# Patient Record
Sex: Female | Born: 1985 | Race: White | Hispanic: No | Marital: Married | Smoking: Never smoker
Health system: Southern US, Community
[De-identification: ages and names within clinical notes are randomized; demographics above are authoritative.]

## PROBLEM LIST (undated history)

## (undated) DIAGNOSIS — O009 Unspecified ectopic pregnancy without intrauterine pregnancy: Secondary | ICD-10-CM

## (undated) HISTORY — DX: Unspecified ectopic pregnancy without intrauterine pregnancy: O00.90

## (undated) HISTORY — PX: OTHER SURGICAL HISTORY: SHX169

---

## 2014-02-07 ENCOUNTER — Emergency Department: Payer: Self-pay | Admitting: Emergency Medicine

## 2014-03-12 ENCOUNTER — Ambulatory Visit: Payer: Self-pay | Admitting: Obstetrics & Gynecology

## 2014-09-15 ENCOUNTER — Ambulatory Visit: Payer: Self-pay | Admitting: Obstetrics & Gynecology

## 2015-03-21 IMAGING — US US BREAST*L* LIMITED INC AXILLA
1 series · 8 of 8 positions shown · non-contrast
Comparison: Outside left breast ultrasounds dated 09/02/2013 and
04/03/2013

CLINICAL DATA: 28-year-old female for follow-up of probably benign
left breast findings.

EXAM:
ULTRASOUND OF THE LEFT BREAST

[Series 1: us breast*left* limited inc axilla · 0.08mm/px · 8 of 8 slices shown]
[im 1/8]
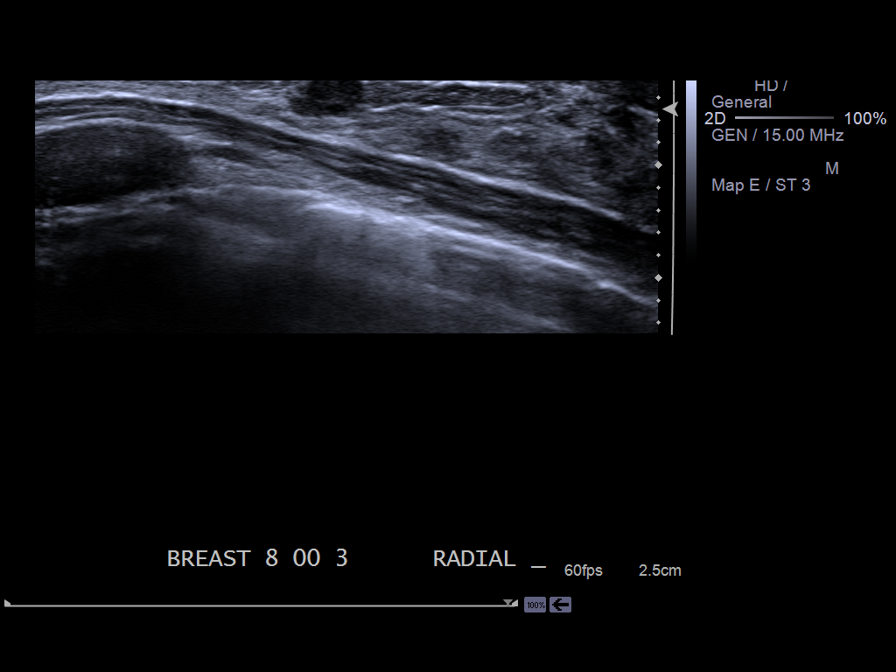
[im 2/8]
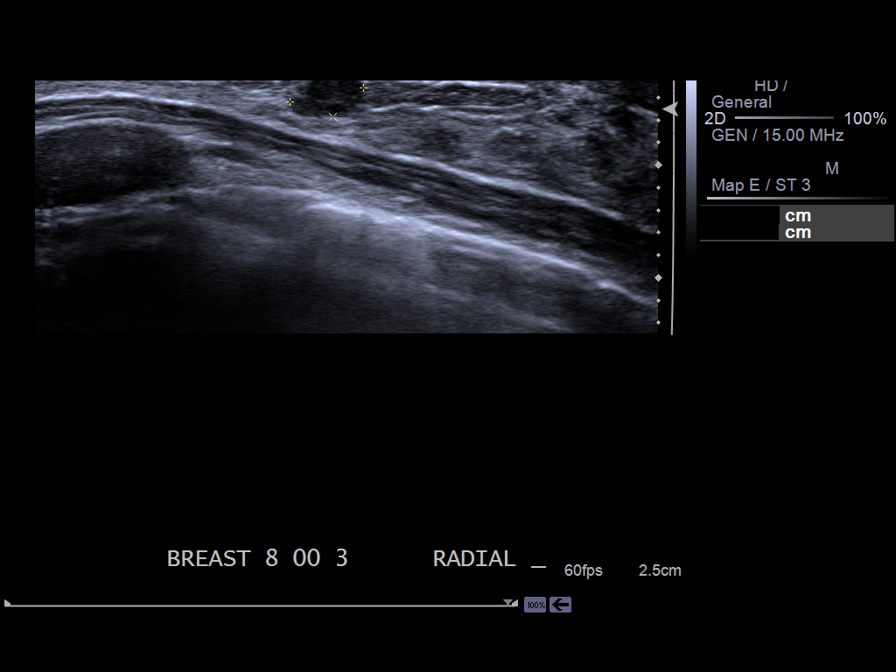
[im 3/8]
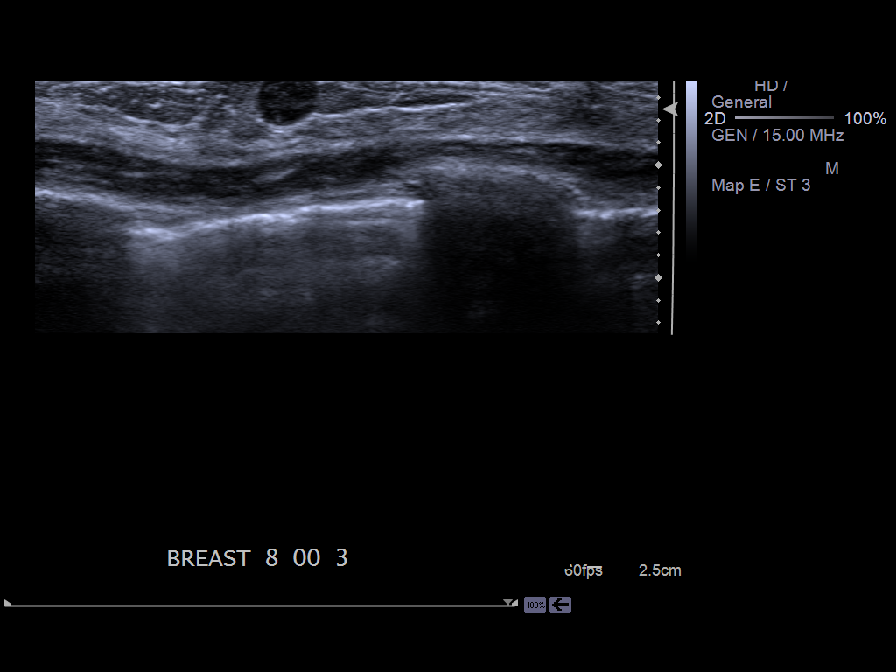
[im 4/8]
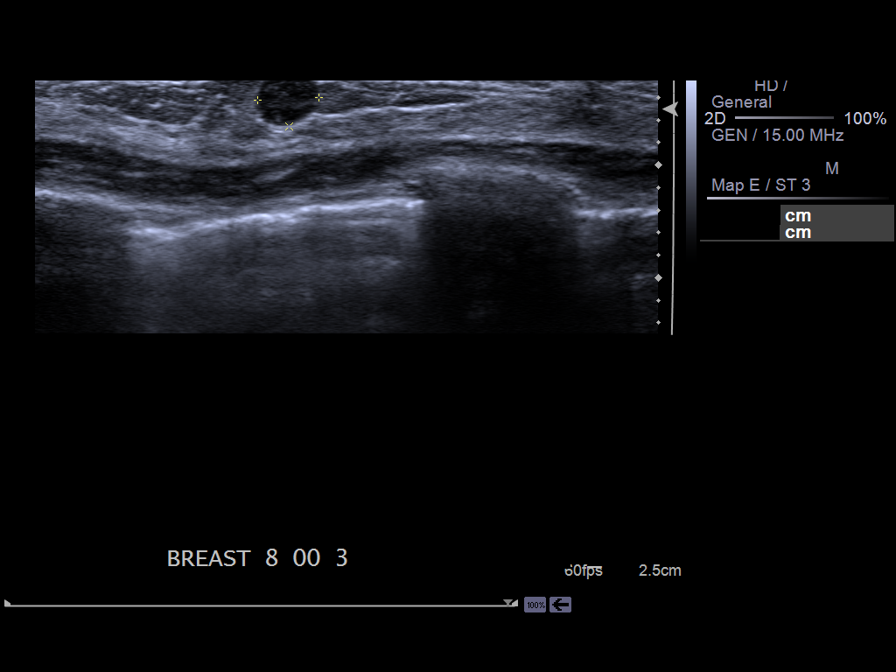
[im 5/8]
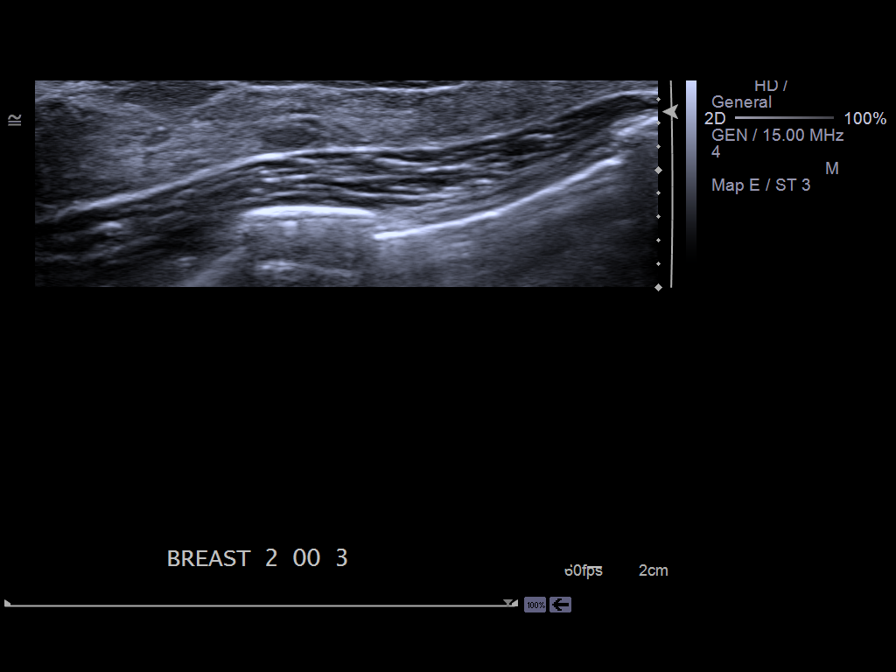
[im 6/8]
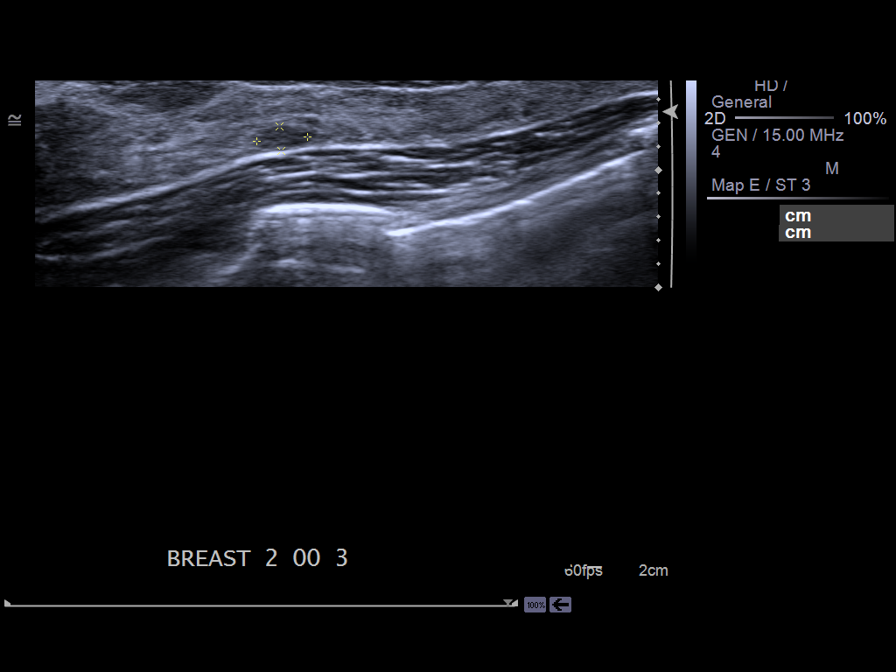
[im 7/8]
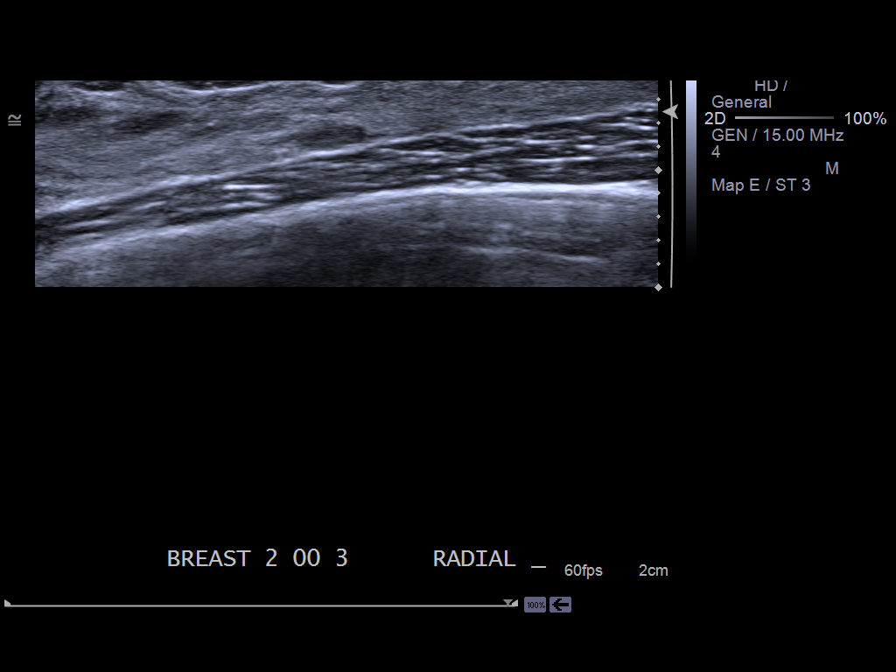
[im 8/8]
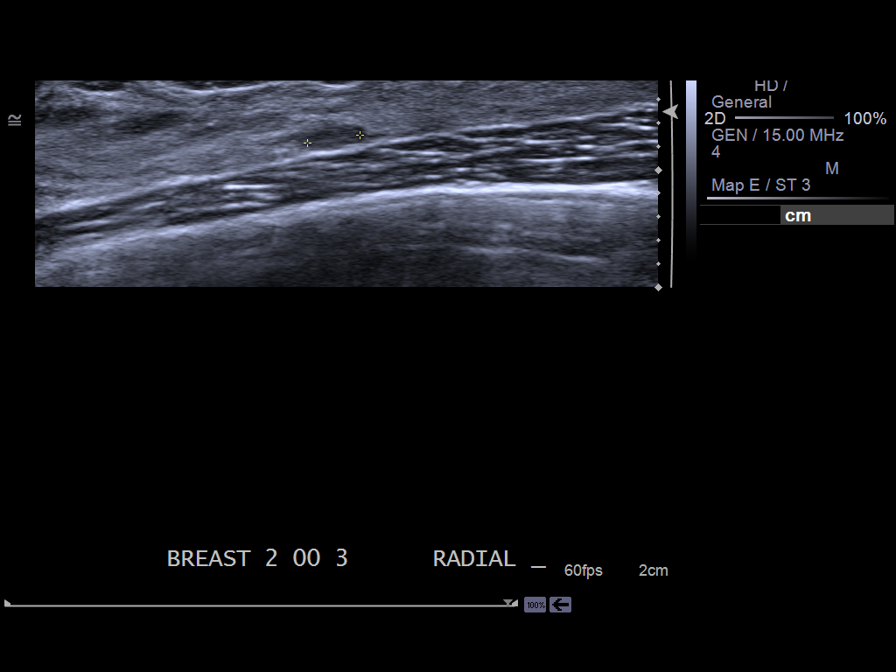

[8 of 8 positions shown; findings below may reference images not displayed]

FINDINGS: On physical exam, a subcentimeter nodule is palpated within the left
breast at 8 o'clock, 3 cm from the nipple. No discrete mass is
palpated within the upper, outer left breast.

Targeted ultrasound of the left breast was performed demonstrating
no significant interval change in the circumscribed, hypoechoic oval
mass at 8 o'clock, 3 cm from the nipple measuring approximately 7 x
4 x 5 mm. In addition, the previously seen oval hypoechoic area
within the upper, outer quadrant is also not significantly changed
measuring 4 x 2 x 5 mm at 2 o'clock, 3 cm from the nipple.
IMPRESSION: Probably benign left breast findings.

RECOMMENDATION:
Left breast ultrasound in 6 months.

I have discussed the findings and recommendations with the patient.
Results were also provided in writing at the conclusion of the
visit. If applicable, a reminder letter will be sent to the patient
regarding the next appointment.

BI-RADS CATEGORY  3: Probably benign finding(s) - short interval
follow-up suggested.

## 2015-04-07 ENCOUNTER — Other Ambulatory Visit: Payer: Self-pay | Admitting: Obstetrics & Gynecology

## 2015-04-07 DIAGNOSIS — N6012 Diffuse cystic mastopathy of left breast: Secondary | ICD-10-CM

## 2015-04-16 ENCOUNTER — Ambulatory Visit
Admission: RE | Admit: 2015-04-16 | Discharge: 2015-04-16 | Disposition: A | Discharge status: Home / Self Care | Payer: BC Managed Care – PPO | Source: Ambulatory Visit | Attending: Obstetrics & Gynecology | Admitting: Obstetrics & Gynecology

## 2015-04-16 DIAGNOSIS — N63 Unspecified lump in breast: Secondary | ICD-10-CM | POA: Diagnosis present

## 2015-04-16 DIAGNOSIS — N6012 Diffuse cystic mastopathy of left breast: Secondary | ICD-10-CM

## 2015-09-24 IMAGING — US US BREAST*L* LIMITED INC AXILLA
1 series · 8 of 8 positions shown · non-contrast
Comparison: Left breast ultrasounds performed at [HOSPITAL]
[HOSPITAL] on 03/12/2014 and at [REDACTED] on
09/02/2013 and 04/03/2013.

CLINICAL DATA: Followup left breast probable fibroadenomas.

EXAM:
ULTRASOUND OF THE LEFT BREAST

[Series 1: us breast*left* limited inc axilla · 0.08mm/px · 8 of 8 slices shown]
[im 1/8]
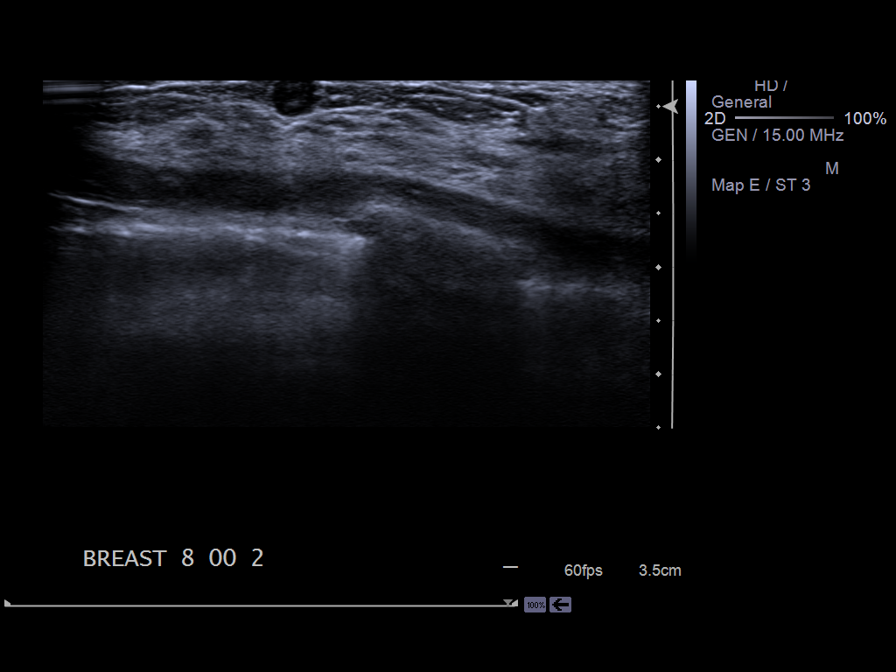
[im 2/8]
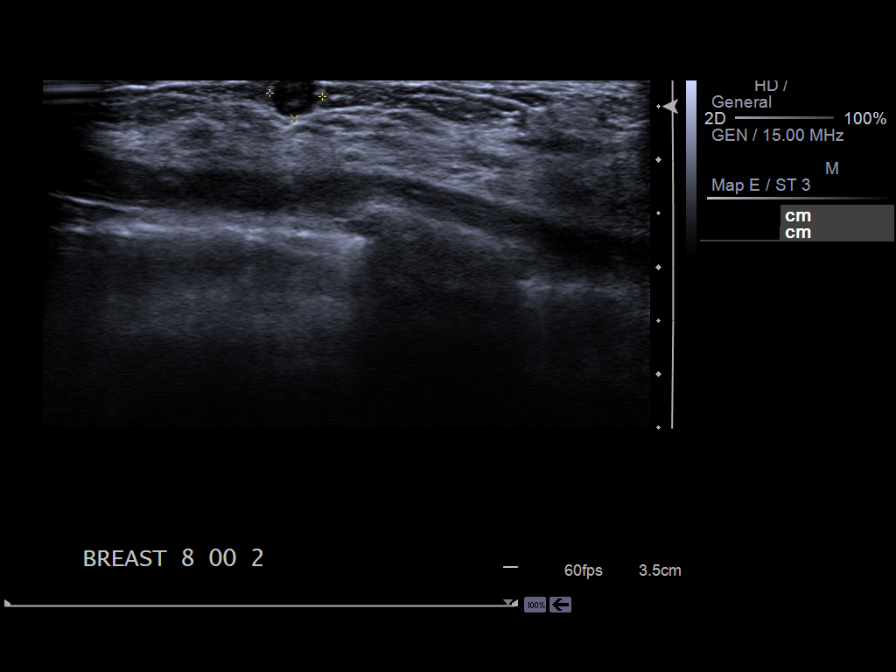
[im 3/8]
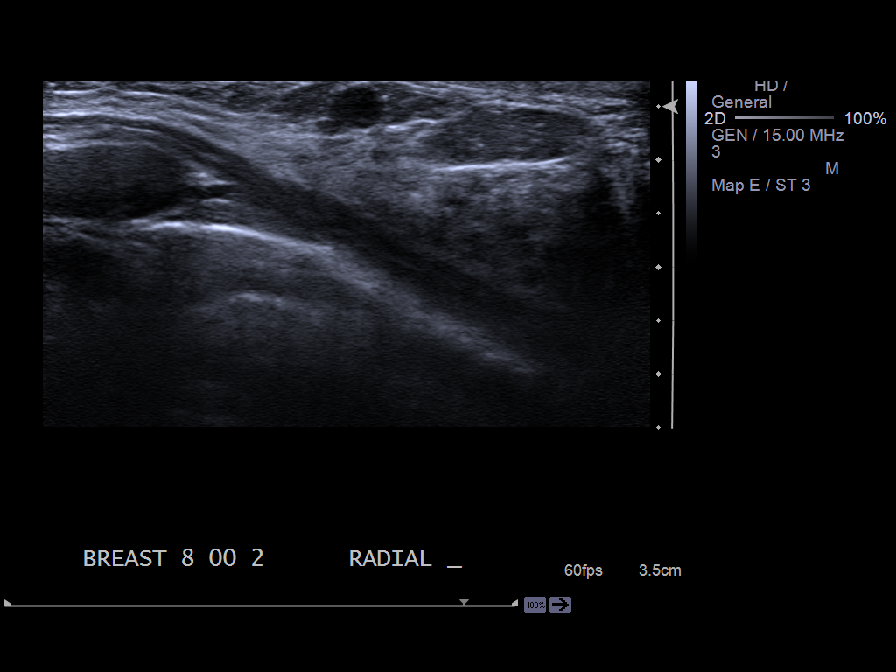
[im 4/8]
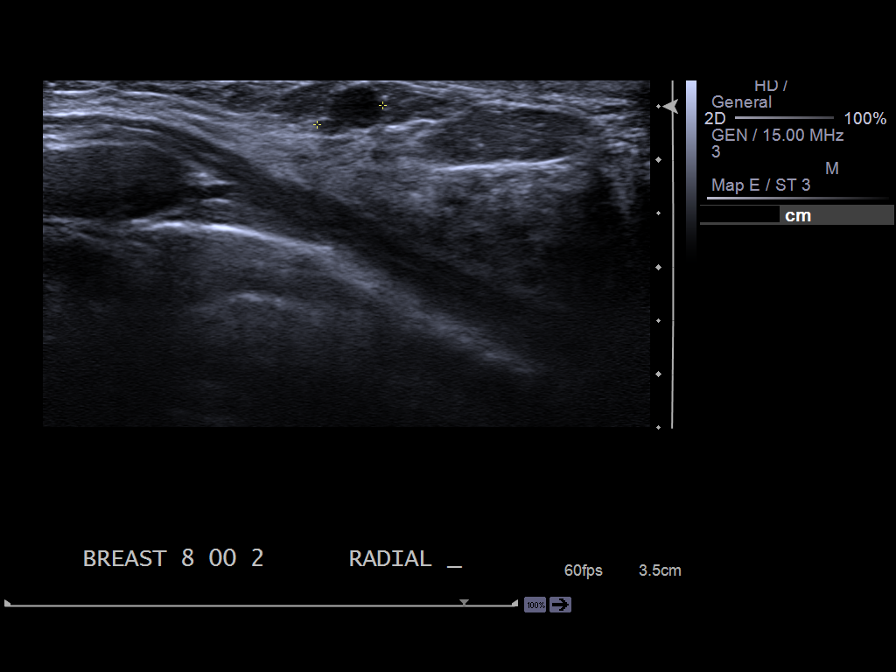
[im 5/8]
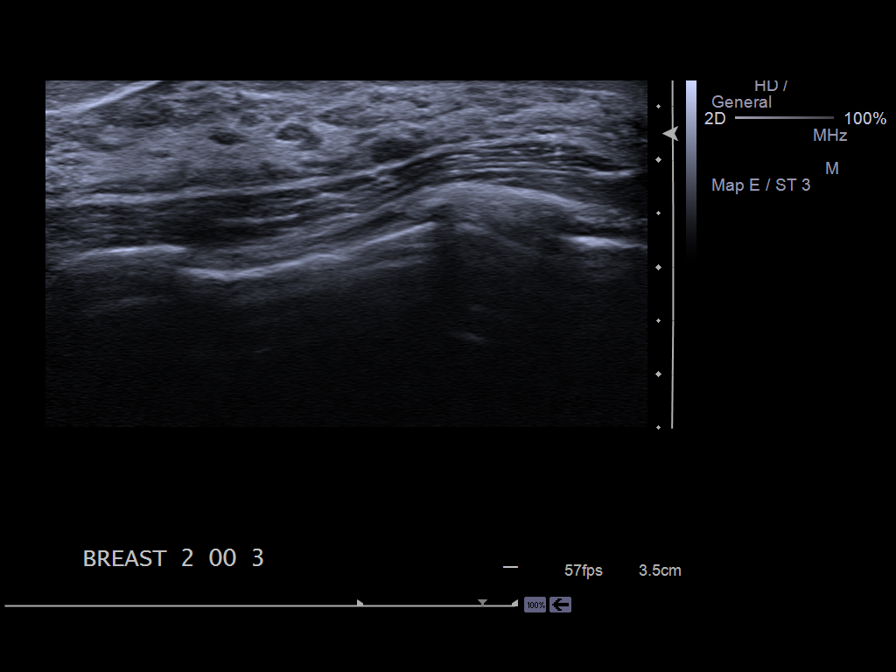
[im 6/8]
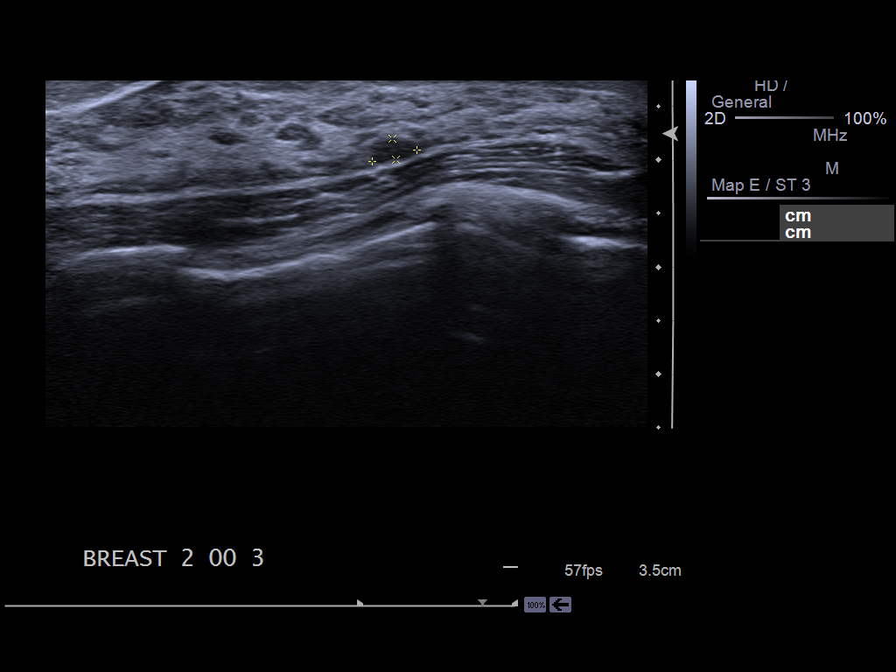
[im 7/8]
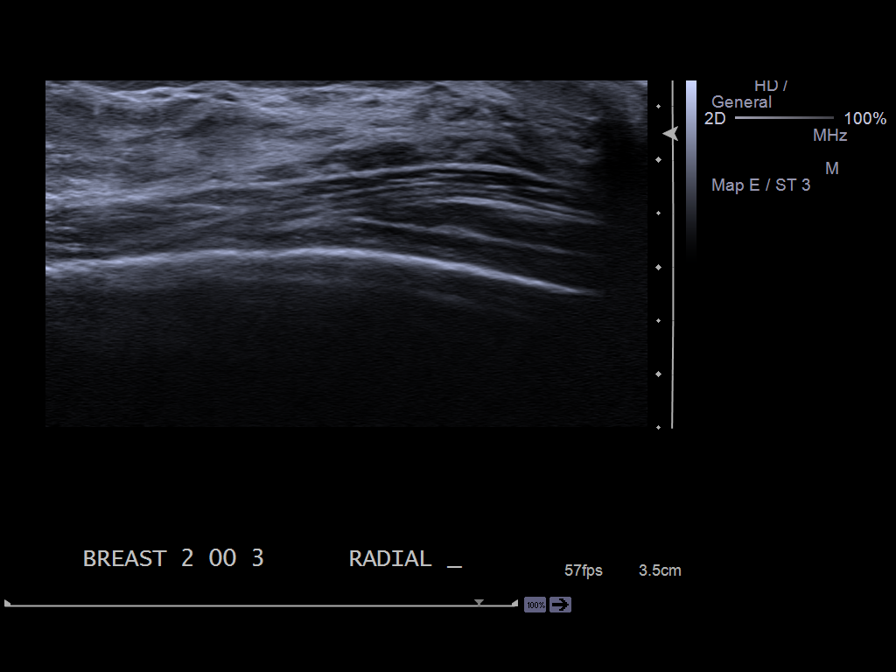
[im 8/8]
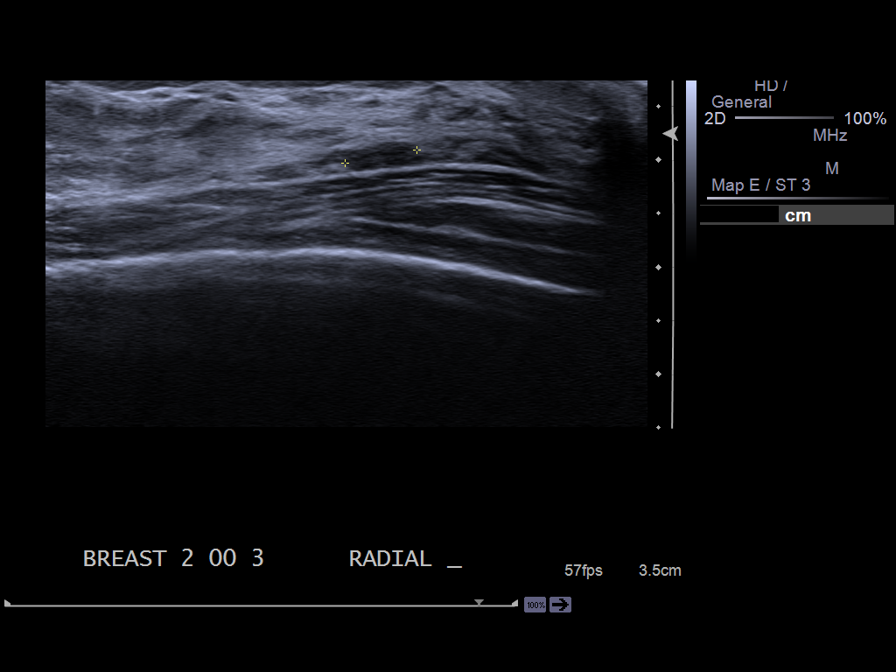

[8 of 8 positions shown; findings below may reference images not displayed]

FINDINGS: On physical exam, no mass is palpable in the lower inner left breast
or upper outer left breast.

Targeted ultrasound is performed, showing a 6 x 5 x 4 mm oval,
circumscribed, horizontally oriented, hypoechoic mass in the 8
o'clock position of the left breast, 2 cm from the nipple. This
measured 7 x 5 x 4 mm on 03/12/2014, 8 x 7 x 4 mm on 09/02/2013 and
9 x 8 x 5 mm on 04/03/2013.

In the 2 o'clock position of the left breast, 3 cm from the nipple,
a 7 x 4 x 2 mm similar appearing mass is demonstrated. This measured
5 x 4 x 2 mm on 03/12/2014 and 4 x 4 x 2 mm on 09/02/2013.
IMPRESSION: Further decrease in size of a small probable fibroadenoma in the 8
o'clock position of the left breast and mild increase in size of a
small probable fibroadenoma in the 2 o'clock position of the left
breast.

RECOMMENDATION:
Left breast ultrasound in 6 months.

I have discussed the findings and recommendations with the patient.
Results were also provided in writing at the conclusion of the
visit. If applicable, a reminder letter will be sent to the patient
regarding the next appointment.

BI-RADS CATEGORY  3: Probably benign.

## 2015-10-01 ENCOUNTER — Other Ambulatory Visit: Payer: Self-pay | Admitting: Obstetrics & Gynecology

## 2015-10-01 DIAGNOSIS — N63 Unspecified lump in unspecified breast: Secondary | ICD-10-CM

## 2015-10-19 IMAGING — US US BREAST*L* LIMITED INC AXILLA
1 series · 12 of 12 positions shown · non-contrast
Comparison: Multiple prior ultrasounds, the most recent dated
04/16/2015.

CLINICAL DATA: 29-year-old female seen for follow-up of 2 probably
benign left breast masses

EXAM:
ULTRASOUND OF THE LEFT BREAST

[Series 1: us breast*left* limited inc axilla · 0.05mm/px · 12 of 12 slices shown]
[im 1/12]
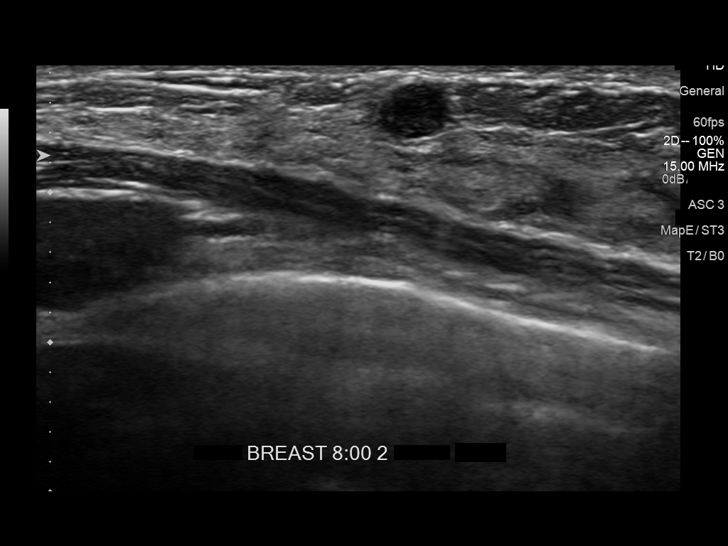
[im 2/12]
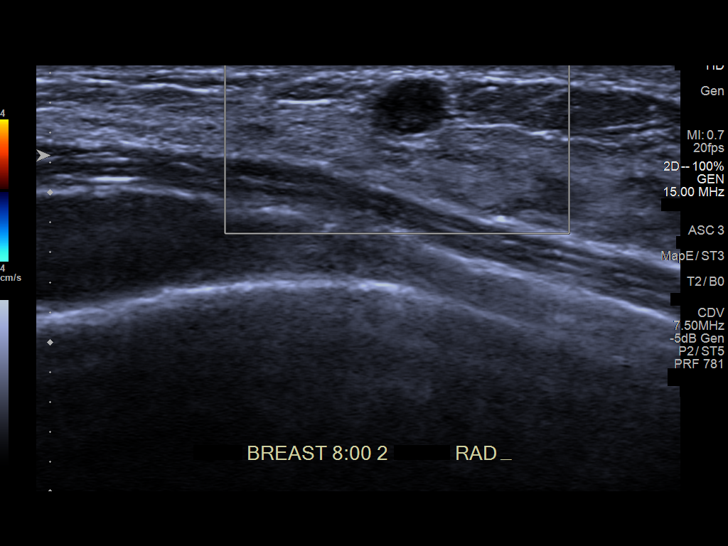
[im 3/12]
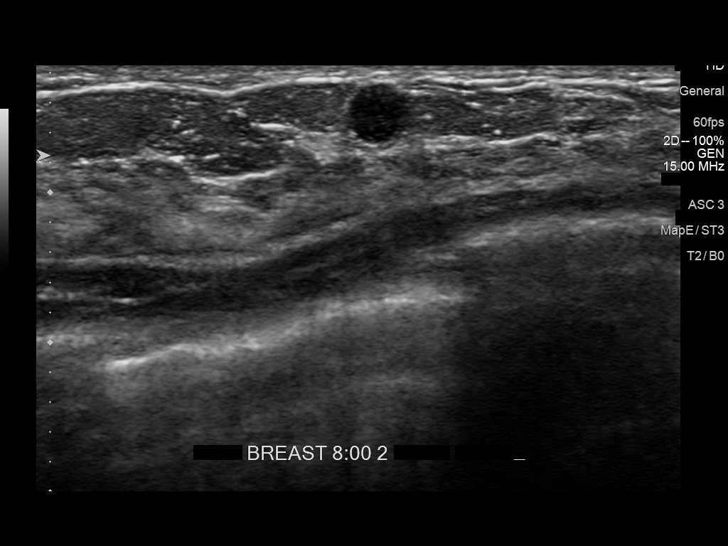
[im 4/12]
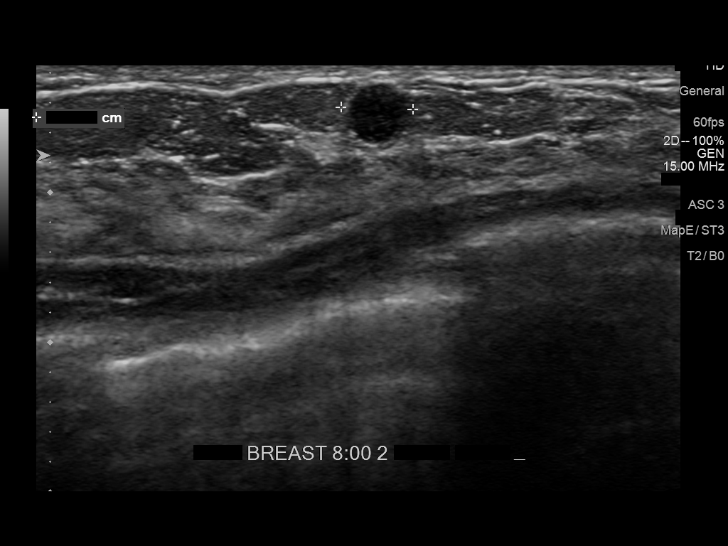
[im 5/12]
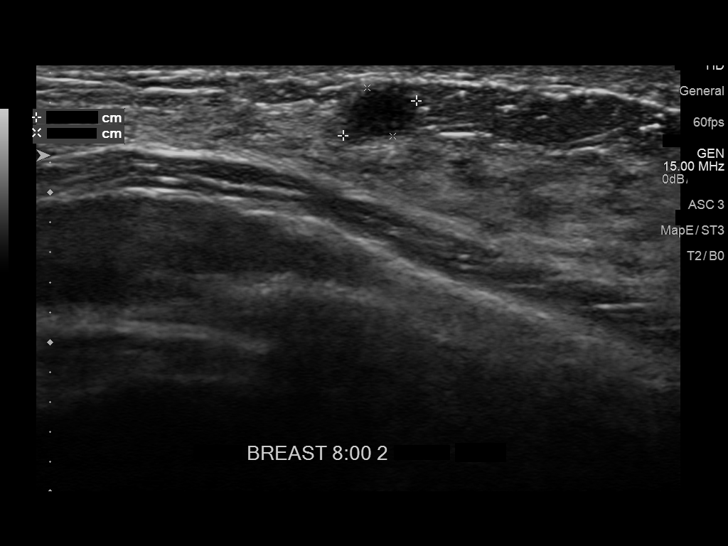
[im 6/12]
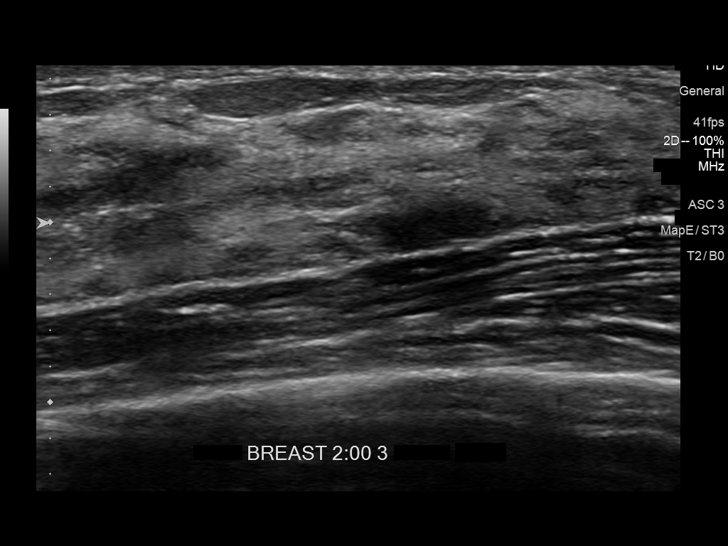
[im 7/12]
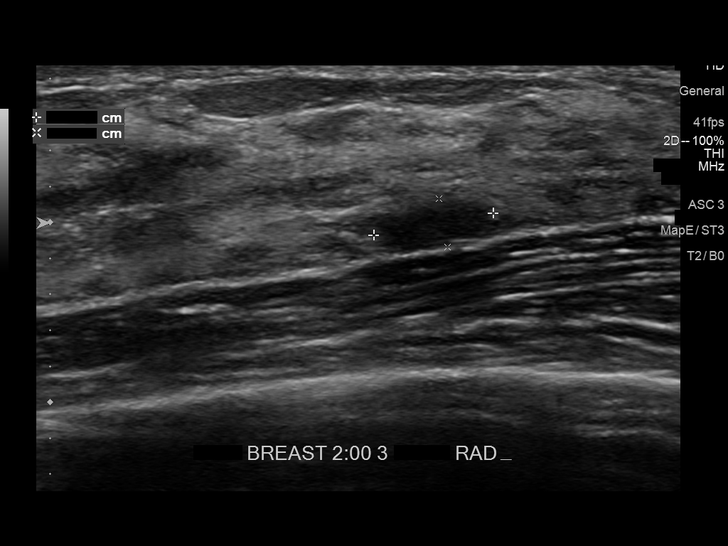
[im 8/12]
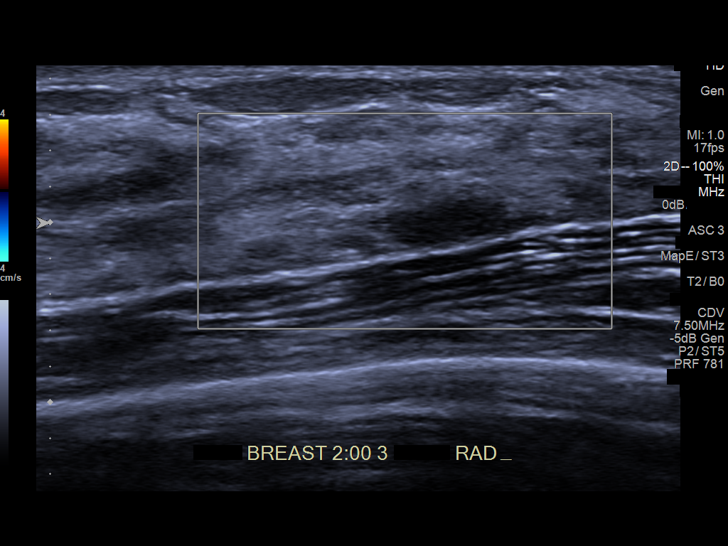
[im 9/12]
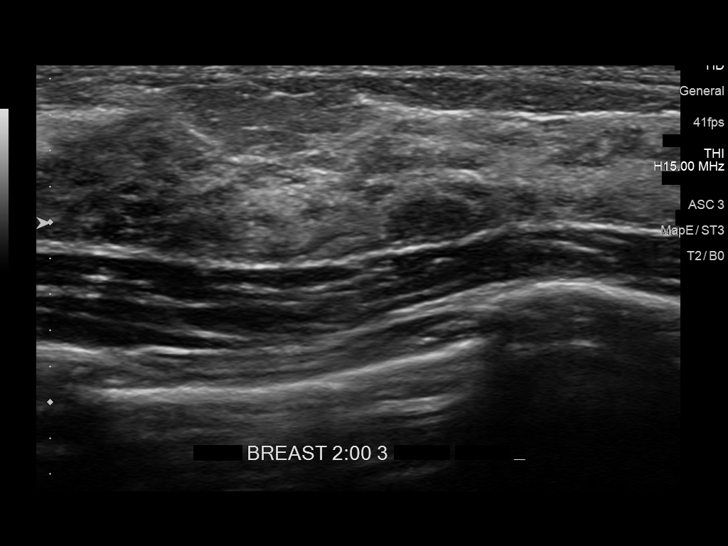
[im 10/12]
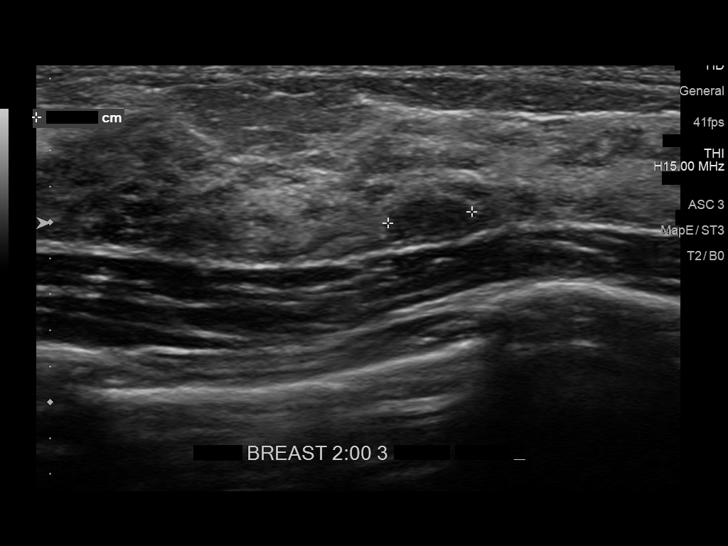
[im 11/12]
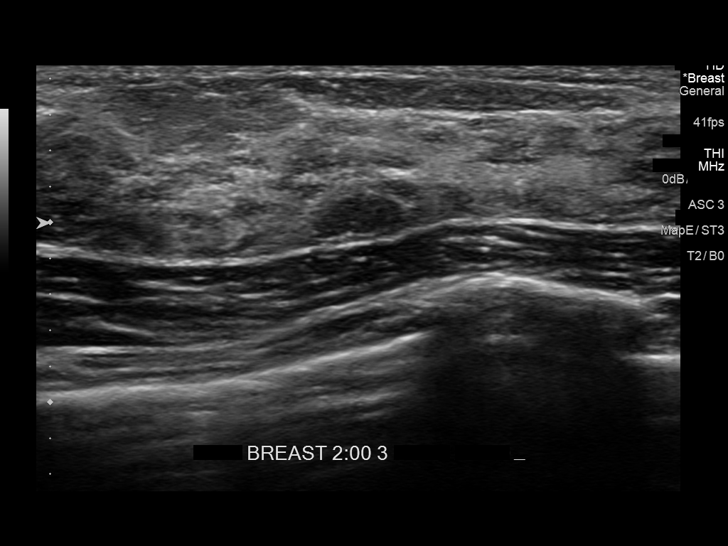
[im 12/12]
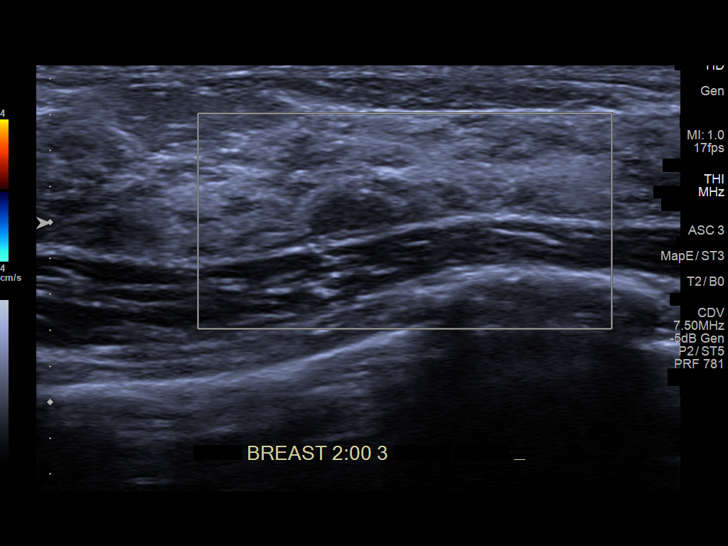

[12 of 12 positions shown; findings below may reference images not displayed]

FINDINGS: Targeted ultrasound of the left breast is performed demonstrating a
5 x 4 x 5 mm oval, circumscribed, hypoechoic mass at 8 o'clock, 2 cm
from the nipple. This mass is similar in size to the most recent
prior study and decreased in size when compared to ultrasound from
04/03/2013. At 2 o'clock, 3 cm from the nipple, there is a
similar-appearing oval, circumscribed, hypoechoic mass measuring 7 x
3 x 5 mm which is not significantly changed from most recent prior
study. The mass appears slightly increased in size when compared to
ultrasound from Sunday September, 2013.
IMPRESSION: Probably benign left breast masses, likely fibroadenomas.

RECOMMENDATION:
Left breast ultrasound in 6 months. This will be 2 years from the
initial imaging of the left breast mass at 2 o'clock, 3 cm from the
nipple.

I have discussed the findings and recommendations with the patient.
Results were also provided in writing at the conclusion of the
visit. If applicable, a reminder letter will be sent to the patient
regarding the next appointment.

BI-RADS CATEGORY  3: Probably benign finding(s) - short interval
follow-up suggested.

## 2015-10-20 ENCOUNTER — Ambulatory Visit
Admission: RE | Admit: 2015-10-20 | Discharge: 2015-10-20 | Disposition: A | Discharge status: Home / Self Care | Payer: BC Managed Care – PPO | Source: Ambulatory Visit | Attending: Obstetrics & Gynecology | Admitting: Obstetrics & Gynecology

## 2015-10-20 DIAGNOSIS — N63 Unspecified lump in unspecified breast: Secondary | ICD-10-CM

## 2016-04-23 IMAGING — US US BREAST*L* LIMITED INC AXILLA
1 series · 12 of 12 positions shown · non-contrast
Comparison: Previous exam(s).

CLINICAL DATA: Followup left breast probable fibroadenomas.

EXAM:
ULTRASOUND OF THE LEFT BREAST

[Series 1: us breast*left* limited inc axilla · 0.03mm/px · 12 of 12 slices shown]
[im 1/12]
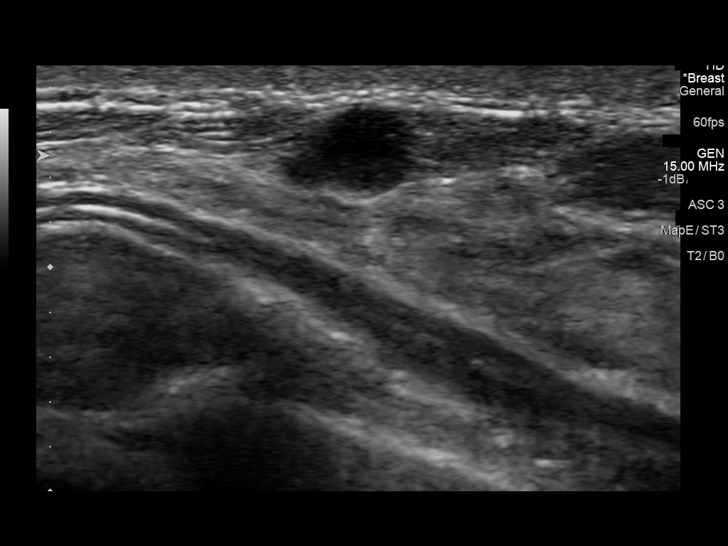
[im 2/12]
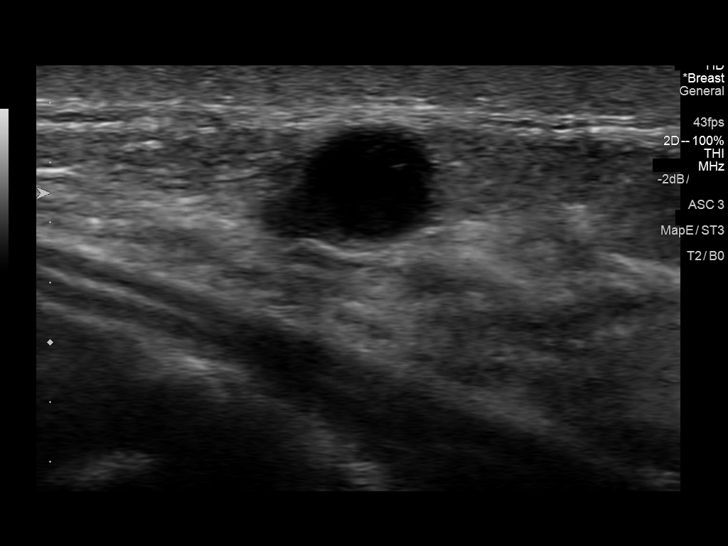
[im 3/12]
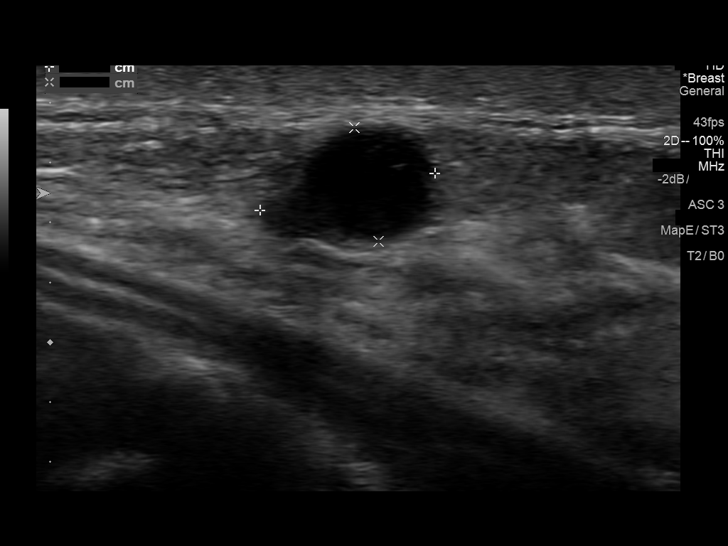
[im 4/12]
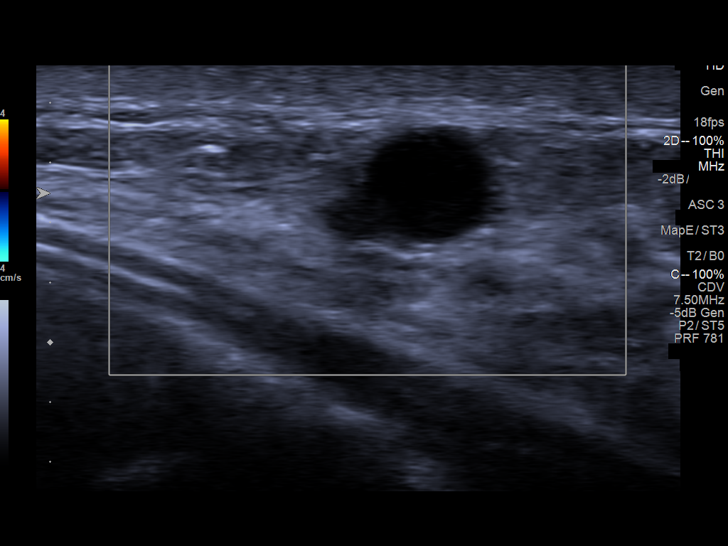
[im 5/12]
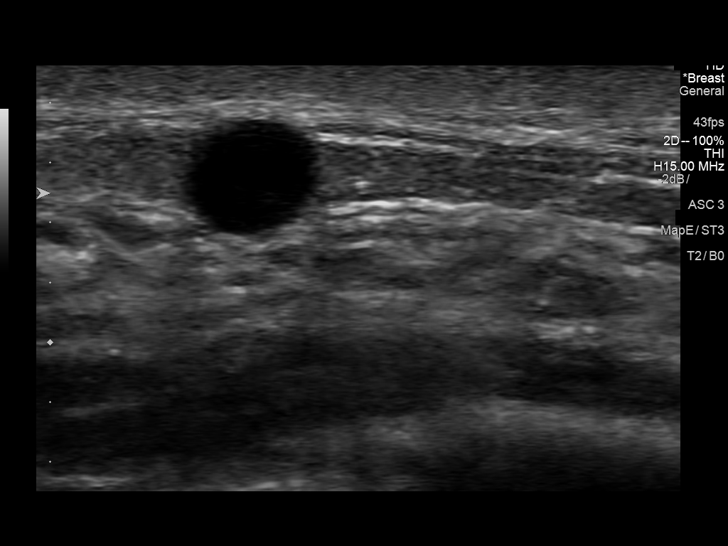
[im 6/12]
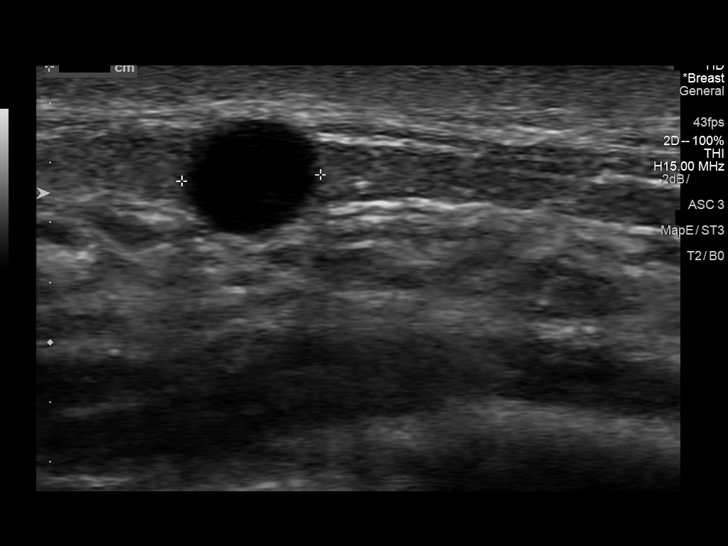
[im 7/12]
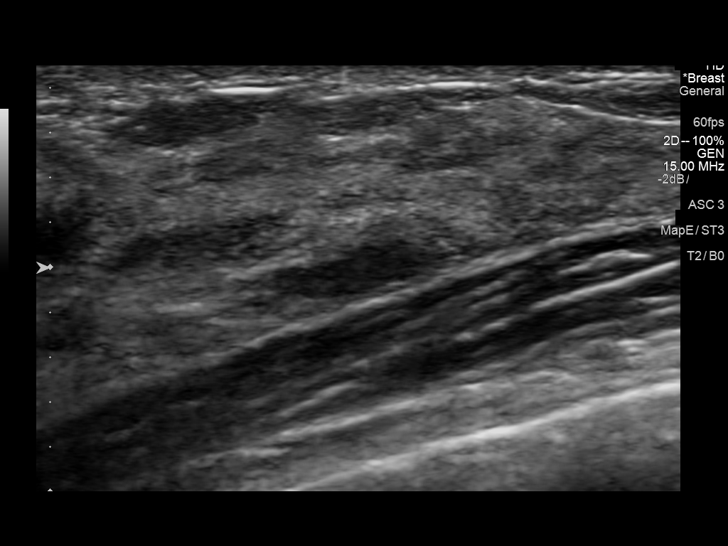
[im 8/12]
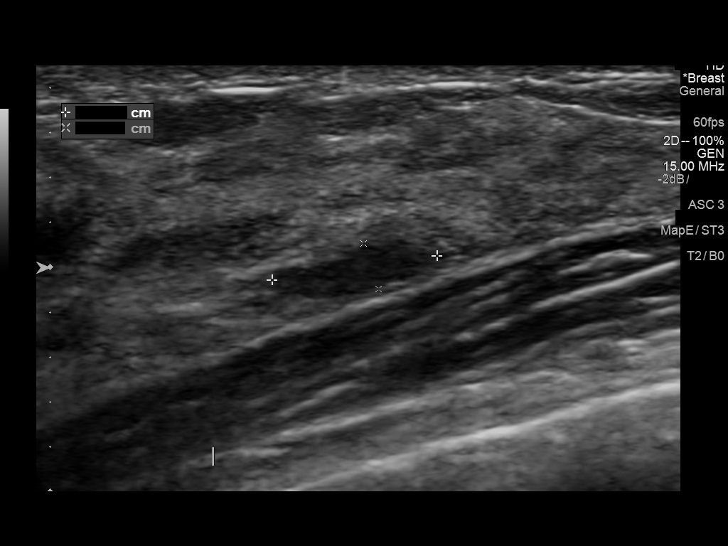
[im 9/12]
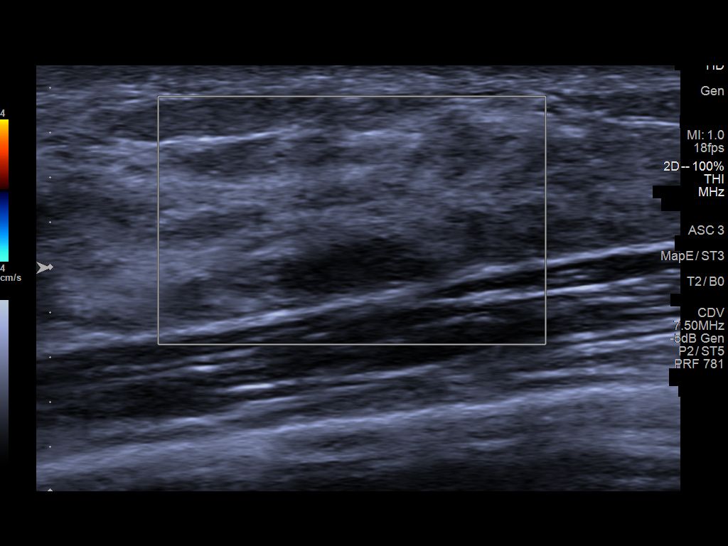
[im 10/12]
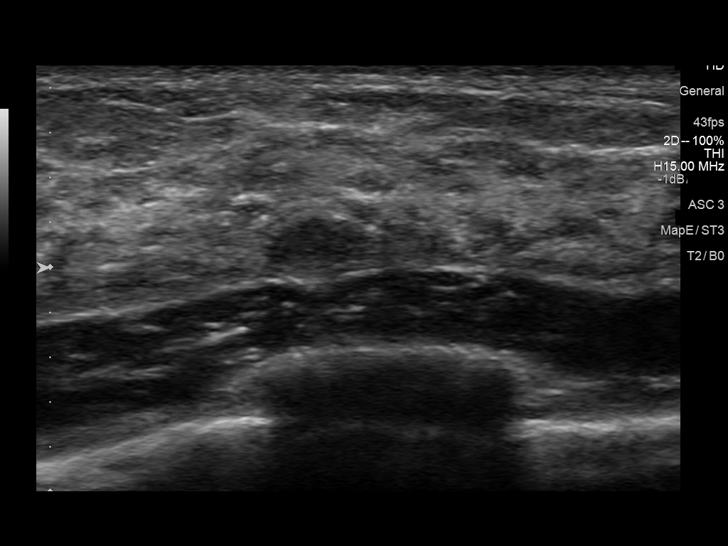
[im 11/12]
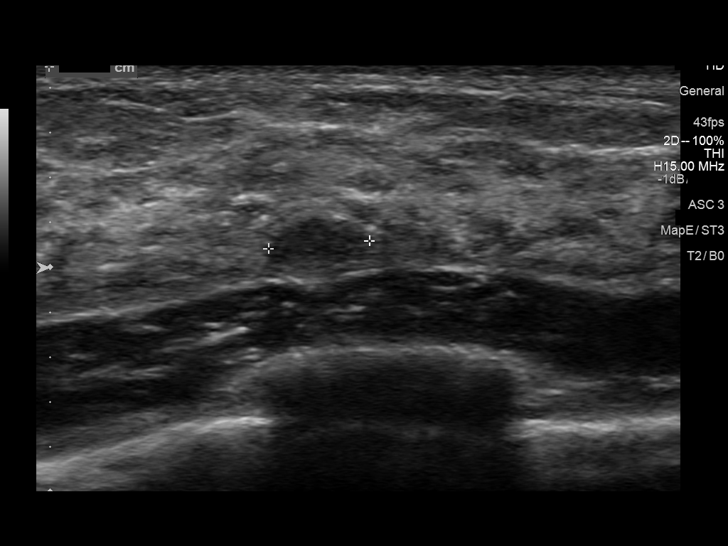
[im 12/12]
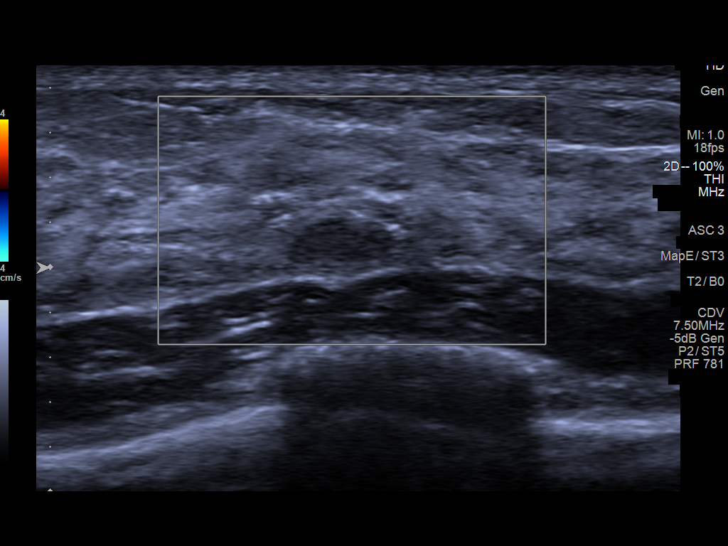

[12 of 12 positions shown; findings below may reference images not displayed]

FINDINGS: On physical exam, no mass is palpable in the 8 o'clock core 2
o'clock position of the left breast.

Targeted ultrasound is performed, showing a 6 x 5 x 4 mm oval,
horizontally oriented, hypoechoic mass in the 8 o'clock position of
the left breast, 2 cm from the nipple. This measured 6 x 5 x 4 mm on
and 9 x 8 x 5 mm on 04/03/2013.

In the 2 o'clock position of the left breast, 3 cm from the nipple,
a 7 x 5 x 2 mm oval, horizontally oriented, hypoechoic mass is
demonstrated posteriorly. This measured 7 x 4 x 2 mm on 09/15/2014,
5 x 4 x 2 mm on 03/12/2014 and 4 x 4 x 2 mm on 09/02/2013.
IMPRESSION: No significant change thin size and appearance of previously
demonstrated probable fibroadenomas in the 8 o'clock and 2 o'clock
positions of the left breast. The 2 year stability is compatible
with a benign process for both masses. No evidence of malignancy.

RECOMMENDATION:
Annual screening mammography beginning at age 40.

I have discussed the findings and recommendations with the patient.
Results were also provided in writing at the conclusion of the
visit. If applicable, a reminder letter will be sent to the patient
regarding the next appointment.

BI-RADS CATEGORY  2: Benign.

## 2016-09-27 ENCOUNTER — Telehealth: Payer: Self-pay

## 2016-09-27 NOTE — Telephone Encounter (Signed)
Pt calling - is having irregular bleeding.  Not currently supposed to be bleeding c bcp but she is.  However she is on her period - it's heavy and clumpy which is not normal for her. Anything to be alarmed about?  [phone].

## 2016-09-27 NOTE — Telephone Encounter (Signed)
Pt aware this is ok. Pt aware to let us know if the bleeding last longer than a regular period,

## 2016-11-14 ENCOUNTER — Other Ambulatory Visit: Payer: Self-pay

## 2016-11-14 ENCOUNTER — Other Ambulatory Visit: Payer: Self-pay | Admitting: Obstetrics & Gynecology

## 2016-11-14 MED ORDER — DICLOFENAC SODIUM 25 MG PO TBEC: 50.0000 mg | DELAYED_RELEASE_TABLET | Freq: Two times a day (BID) | ORAL | Status: DC | PRN

## 2016-11-14 MED ORDER — DICLOFENAC SODIUM 75 MG PO TBEC: 75.0000 mg | DELAYED_RELEASE_TABLET | Freq: Two times a day (BID) | ORAL | 3 refills | Status: DC

## 2016-11-14 NOTE — Telephone Encounter (Signed)
Rx printed to fax, as dont have pharmacy

## 2016-11-14 NOTE — Telephone Encounter (Signed)
Pt states she has schedule her AE w/[facility] for 02/2017. She is requesting a refill on her Diclofenac Potassium 50 mg Tablets. Cb#[phone].

## 2016-12-23 ENCOUNTER — Telehealth: Payer: Self-pay | Admitting: Obstetrics & Gynecology

## 2016-12-23 NOTE — Telephone Encounter (Signed)
Pt is calling needing to speak with Dr. Tiburcio PeaHarris nurse. Pt just found out she is pregnant and has some question please call patient.

## 2016-12-26 NOTE — Telephone Encounter (Signed)
Check and see how she is doing and if we could help with doing any lab levels here in follow up, or is that already taken care of. Thx.

## 2016-12-26 NOTE — Telephone Encounter (Signed)
Already taken care of

## 2016-12-26 NOTE — Telephone Encounter (Signed)
[facility]  [facility] side back and leg, had u/s and MRI pt had a ectopic pregnancy they gave her a injection and monitoring her HCG levels, pt just wanted to let you know.

## 2017-01-18 ENCOUNTER — Encounter: Payer: Self-pay | Admitting: Obstetrics & Gynecology

## 2017-01-18 ENCOUNTER — Encounter: Payer: BC Managed Care – PPO | Admitting: Obstetrics & Gynecology

## 2017-01-18 ENCOUNTER — Ambulatory Visit (INDEPENDENT_AMBULATORY_CARE_PROVIDER_SITE_OTHER): Payer: BC Managed Care – PPO | Admitting: Obstetrics & Gynecology

## 2017-01-18 VITALS — BP 128/82 | HR 77 | Ht 64.0 in | Wt 106.0 lb

## 2017-01-18 DIAGNOSIS — Z8759 Personal history of other complications of pregnancy, childbirth and the puerperium: Secondary | ICD-10-CM

## 2017-01-18 NOTE — Patient Instructions (Signed)
Monitor for pregnancy and then have early labs done for Beta HCG levels.

## 2017-01-18 NOTE — Progress Notes (Signed)
  Missed Menses/ Possible Pregnancy Patient complains of menstrual irregularity.  Pt has had recent ectopic pregnancy on the RIGHT and also prior h/o PCOS w irreg cycles.  Is concerned for future preg options and timing/planning. Sexual Activity: single partner, contraception: none. Current symptoms also include: min pain and has healed well from lap/RS.Marland Kitchen. Last period was about a week after surgery.   Patient's last menstrual period was 01/02/2017.   PMHx: She  has a past medical history of Ectopic pregnancy. Also,  has no past surgical history on file., family history includes Diabetes in her father.,  reports that she has never smoked. She has never used smokeless tobacco. She reports that she drinks alcohol. She reports that she does not use drugs.  She currently has no medications in their medication list. Also, is allergic to terbinafine.  Review of Systems  Constitutional: Negative for chills, fever and malaise/fatigue.  HENT: Negative for congestion, sinus pain and sore throat.   Eyes: Negative for blurred vision and pain.  Respiratory: Negative for cough and wheezing.   Cardiovascular: Negative for chest pain and leg swelling.  Gastrointestinal: Negative for abdominal pain, constipation, diarrhea, heartburn, nausea and vomiting.  Genitourinary: Negative for dysuria, frequency, hematuria and urgency.  Musculoskeletal: Negative for back pain, joint pain, myalgias and neck pain.  Skin: Negative for itching and rash.  Neurological: Negative for dizziness, tremors and weakness.  Endo/Heme/Allergies: Does not bruise/bleed easily.  Psychiatric/Behavioral: Negative for depression. The patient is not nervous/anxious and does not have insomnia.     Objective: BP 128/82   Pulse 77   Ht 5\' 4"  (1.626 m)   Wt 106 lb (48.1 kg)   LMP 01/02/2017   BMI 18.19 kg/m  Physical Exam  Constitutional: She is oriented to person, place, and time. She appears well-developed and well-nourished. No  distress.  Musculoskeletal: Normal range of motion.  Neurological: She is alert and oriented to person, place, and time.  Skin: Skin is warm and dry.  Psychiatric: She has a normal mood and affect.  Vitals reviewed.   ASSESSMENT/PLAN:    Problem List Items Addressed This Visit      Other   History of ectopic pregnancy - Primary    A total of 25 minutes were spent face-to-face with the patient during this encounter and over half of that time dealt with counseling and coordination of care. Preconceptual counseling, prior ectopic and now risk factors moving forward, PCOS risk factors all discussed.  Annamarie MajorPaul Cosimo Schertzer, MD, Merlinda FrederickFACOG [facility], [facility] 01/18/2017  9:22 AM

## 2017-02-24 ENCOUNTER — Other Ambulatory Visit: Payer: Self-pay

## 2017-02-24 ENCOUNTER — Telehealth: Payer: Self-pay | Admitting: Obstetrics & Gynecology

## 2017-02-24 DIAGNOSIS — Z30011 Encounter for initial prescription of contraceptive pills: Secondary | ICD-10-CM

## 2017-02-24 MED ORDER — NORETHINDRONE 0.35 MG PO TABS: 1.0000 | ORAL_TABLET | Freq: Every day | ORAL | 1 refills | Status: AC

## 2017-02-24 NOTE — Telephone Encounter (Signed)
Please Refill bc to get until pt's appt. [facility] in [place], on [address]

## 2017-02-27 ENCOUNTER — Ambulatory Visit: Payer: Self-pay | Admitting: Obstetrics & Gynecology

## 2017-04-06 ENCOUNTER — Ambulatory Visit: Payer: Self-pay | Admitting: Obstetrics & Gynecology

## 2021-01-21 ENCOUNTER — Ambulatory Visit: Payer: BC Managed Care – PPO | Attending: Nurse Practitioner | Admitting: Physical Therapy

## 2021-01-21 ENCOUNTER — Encounter: Payer: Self-pay | Admitting: Physical Therapy

## 2021-01-21 ENCOUNTER — Other Ambulatory Visit: Payer: Self-pay

## 2021-01-21 DIAGNOSIS — R278 Other lack of coordination: Secondary | ICD-10-CM | POA: Insufficient documentation

## 2021-01-21 DIAGNOSIS — M6281 Muscle weakness (generalized): Secondary | ICD-10-CM | POA: Diagnosis present

## 2021-01-21 DIAGNOSIS — M6208 Separation of muscle (nontraumatic), other site: Secondary | ICD-10-CM | POA: Diagnosis present

## 2021-01-21 NOTE — Therapy (Signed)
[facility] [address] [place], [place], [address] Phone: [phone]   Fax:  [phone]  Physical Therapy Evaluation  Patient Details  Name: Megan PughCaitlin Goodwin MRN: [id number] Date of Birth: March 22, 1986 Referring Provider (PT): Lilian KapurHuprich, Erin   Encounter Date: 01/21/2021   PT End of Session - 01/21/21 1546     Visit Number 1    Number of Visits 12    Date for PT Re-Evaluation 04/15/21    Authorization Type IE 01/21/2021    PT Start Time 1545    PT Stop Time 1625    PT Time Calculation (min) 40 min    Activity Tolerance Patient tolerated treatment well    Behavior During Therapy [facility] for tasks assessed/performed             Past Medical History:  Diagnosis Date   Ectopic pregnancy     Past Surgical History:  Procedure Laterality Date   laparascopic surgical procedure     2/2 to ectopic pregnancy    There were no vitals filed for this visit.    PELVIC HEALTH PHYSICAL THERAPY EVALUATION  SCREENING Red Flags: None Have you had any night sweats? Unexplained weight loss? Saddle anesthesia? Unexplained changes in bowel or bladder habits?   SUBJECTIVE  Chief Complaint: Patient notes that with G1P1 she had significant DRAM and found pelvic PT to be useful in her recovery; at 6 week follow-up with this pregnancy (11/2020)she was noted to have mild DRAM. Patient reports that a few weeks ago she had new onset of low back pain which she believes may have been from overexertion. At this time she is interested in proactive management of abdominal separation and low back pain to prevent progression.   Patient reports that LBP is dependent on her ability to engage her core muscles before lifting something. Patient was lifting stroller/car seat/toddler and would feel a pull in the low back. Patient notes regular movement eases pain. Patient has increased discomfort with prolonged sitting. Bending down and sitting are most  provocative for pain. Patient notes that with testing at [facility] she was found to have > tightness at SIJ L>R.   Pertinent History:  Falls Negative.  Scoliosis Positive for potential functional scoliosis 2/2 to back spasm on L. Pulmonary disease/dysfunction Negative. Surgical history: Positive for see above.   Obstetrical History: G3P2 (1 ectopic) Deliveries: induced vaginal delivery Tearing/Episiotomy: 2nd degree with left labial repair x2  Gynecological History: Hysterectomy: No Vaginal/Abdominal Endometriosis: Negative Pelvic Organ Prolapse: Negative Last Menstrual Period:  Pain with exam: No Heaviness/pressure: No    Urinary History: Incontinence: Positive for occasional post-void dribble 25%. Onset: 01/14/2021 Triggers: standing after voiding. Amount: Min. Protective undergarments: No  Fluid Intake: 48+ oz H20, 1 cup of coffee caffeinated, no juices/sodas Nocturia: 0-1x/night Frequency of urination: every 2 hours Pain with urination: Negative Difficulty initiating urination: Negative Intermittent stream: Negative Frequent UTI: Negative.   Gastrointestinal History: [place] Stool Chart: Type 2, 4 Frequency of BMs: 2-3x/day Pain with defecation: Negative Straining with defecation: Positive for occasionally with constipation (<25%). Hemorrhoids: Positive ;external; active Toileting posture: squatty potty Incontinence: Negative.   Sexual activity/pain: Pain with intercourse: Negative.   Location of pain: low back pain  Current pain:  0/10  Max pain:  3/10 (in past week) Least pain:  0/10 Pain quality: pain quality: sharp Radiating pain: No   Patient assessment of present state: Needs more postural endurance/strength  Current activities:  Walking, chasing toddler, cooking, Pilates   OBJECTIVE  Mental Status Patient is oriented to person, place and time.  Recent memory is intact.  Remote memory is intact.  Attention span and concentration are intact.   Expressive speech is intact.  Patient's fund of knowledge is within normal limits for educational level.  POSTURE/OBSERVATIONS:  Lumbar lordosis: mildly increased Thoracic kyphosis: mildly increased Iliac crest height: L slightly elevated and anterior on visual assessment  GAIT: Grossly WNL.  RANGE OF MOTION: deferred 2/2 to time constraints   LEFT RIGHT  Lumbar forward flexion (65):      Lumbar extension (30):     Lumbar lateral flexion (25):     Thoracic and Lumbar rotation (30 degrees):       Hip Flexion (0-125):      Hip IR (0-45):     Hip ER (0-45):     Hip Abduction (0-40):     Hip extension (0-15):       STRENGTH: MMT deferred 2/2 to time constraints  RLE LLE  Hip Flexion    Hip Extension    Hip Abduction     Hip Adduction     Hip ER     Hip IR     Knee Extension    Knee Flexion    Dorsiflexion     Plantarflexion (seated)     ABDOMINAL: deferred 2/2 to time constraints Palpation: Diastasis: Scar mobility: Rib flare:  SPECIAL TESTS: deferred 2/2 to time constraints   PHYSICAL PERFORMANCE MEASURES: STS: WNL  EXTERNAL PELVIC EXAM: deferred 2/2 to time constraints Palpation: Breath coordination: Voluntary Contraction: present/absent Relaxation: full/delayed/non-relaxing Perineal movement with sustained IAP increase ("bear down"): descent/no change/elevation/excessive descent Perineal movement with rapid IAP increase ("cough"): elevation/no change/descent  INTERNAL VAGINAL EXAM: not indicated Introitus Appears:  Skin integrity:  Scar mobility: Strength (PERF):  Symmetry: Palpation: Prolapse:   INTERNAL RECTAL EXAM: not indicated Strength (PERF): Symmetry: Palpation: Prolapse:   OUTCOME MEASURES: FOTO (Bowel Constipation 68)   ASSESSMENT Patient is a 35 year old presenting to clinic with chief complaints of lack of postural endurance. Today's evaluation is suggestive of deficits in posture, pain, core strength, and PFM function as  evidenced by constipation, increased low back pain with bending, sitting, and lifting (3/10), and increased L IC height in relationship to R. Patient's responses on FOTO outcome measures (Bowel Constipation 68) indicate moderate functional limitations/disability/distress. Patient's progress may be limited due to the demands of caregiving; however, patient's motivation and past success with PT are advantageous. Patient will benefit from continued skilled therapeutic intervention to address deficits in posture, pain, core strength, and PFM function in order to increase function and improve overall QOL.  EDUCATION Patient educated on prognosis, POC, and provided with HEP including: not initiated. Patient articulated understanding and returned demonstration. Patient will benefit from further education in order to maximize compliance and understanding for long-term therapeutic gains.     Objective measurements completed on examination: See above findings.       PT Long Term Goals - 01/22/21 1049       PT LONG TERM GOAL #1   Title Patient will demonstrate independence with HEP in order to maximize therapeutic gains and improve carryover from physical therapy sessions to ADLs in the home and community.    Baseline IE: not initiated    Time 12    Period Weeks    Status New    Target Date 04/15/21      PT LONG TERM GOAL #2   Title Patient will demonstrate improved function as evidenced by  a score of 71 on FOTO measure for full participation in activities at home and in the community.    Baseline IE: 68    Time 12    Period Weeks    Status New    Target Date 04/15/21      PT LONG TERM GOAL #3   Title Patient will decrease worst pain as reported on NPRS by at least 2 points to demonstrate clinically significant reduction in pain in order to restore/improve function and overall QOL.    Baseline IE: 3/10    Time 12    Period Weeks    Status New    Target Date 04/15/21      PT LONG TERM  GOAL #4   Title Patient will demonstrate improved PFM coordination as evidenced by post-micturition UI occurring < 2 times over the course of a 10 day period for improved function at home and in the community.    Baseline IE: 25%    Time 12    Period Weeks    Status New    Target Date 04/15/21      PT LONG TERM GOAL #5   Title Patient will demonstrate appropriate body mechanics with lifting and squatting with IAP management strategies for improved function at home and in the community.    Baseline IE: not demonstrated    Time 12    Period Weeks    Status New    Target Date 04/15/21                    Plan - 01/22/21 1014     Clinical Impression Statement Patient is a 35 year old presenting to clinic with chief complaints of lack of postural endurance. Today's evaluation is suggestive of deficits in posture, pain, core strength, and PFM function as evidenced by constipation, increased low back pain with bending, sitting, and lifting (3/10), and increased L IC height in relationship to R. Patient's responses on FOTO outcome measures (Bowel Constipation 68) indicate moderate functional limitations/disability/distress. Patient's progress may be limited due to the demands of caregiving; however, patient's motivation and past success with PT are advantageous. Patient will benefit from continued skilled therapeutic intervention to address deficits in posture, pain, core strength, and PFM function in order to increase function and improve overall QOL.    Personal Factors and Comorbidities Behavior Pattern;Comorbidity 1;Time since onset of injury/illness/exacerbation;Past/Current Experience;Profession    Comorbidities HTN    Examination-Activity Limitations Lift;Squat;Bend;Sit    Examination-Participation Restrictions Laundry;Cleaning;Yard Work;Community Activity    Stability/Clinical Decision Making Evolving/Moderate complexity    Clinical Decision Making Moderate    Rehab Potential  Excellent    PT Frequency 1x / week    PT Duration 12 weeks    PT Treatment/Interventions Cryotherapy;Moist Heat;Electrical Stimulation;Therapeutic exercise;Neuromuscular re-education;Patient/family education;Orthotic Fit/Training;Manual techniques;Scar mobilization;Taping;Dry needling;Spinal Manipulations;Joint Manipulations    PT Next Visit Plan physical assessment    PT Home Exercise Plan not initiated    Consulted and Agree with Plan of Care Patient             Patient will benefit from skilled therapeutic intervention in order to improve the following deficits and impairments:  Postural dysfunction, Improper body mechanics, Decreased strength, Decreased coordination, Decreased activity tolerance, Decreased endurance  Visit Diagnosis: Other lack of coordination  Diastasis recti  Muscle weakness (generalized)     Problem List Patient Active Problem List   Diagnosis Date Noted   History of ectopic pregnancy 01/18/2017    Sheria Lang PT, DPT #  [id number]  01/22/2021, 10:51 AM  [place] [address] [place], [place], [address] Phone: [phone]   Fax:  [phone]  Name: Megan Goodwin MRN: [id number] Date of Birth: [birth year]

## 2021-01-28 ENCOUNTER — Ambulatory Visit: Payer: BC Managed Care – PPO | Admitting: Physical Therapy

## 2021-01-28 ENCOUNTER — Other Ambulatory Visit: Payer: Self-pay

## 2021-01-28 ENCOUNTER — Encounter: Payer: Self-pay | Admitting: Physical Therapy

## 2021-01-28 DIAGNOSIS — M6208 Separation of muscle (nontraumatic), other site: Secondary | ICD-10-CM

## 2021-01-28 DIAGNOSIS — M6281 Muscle weakness (generalized): Secondary | ICD-10-CM

## 2021-01-28 DIAGNOSIS — R278 Other lack of coordination: Secondary | ICD-10-CM

## 2021-01-28 NOTE — Therapy (Signed)
[facility] [address] [place], [place], [address] Phone: [phone]   Fax:  [phone]  Physical Therapy Treatment  Patient Details  Name: Megan Goodwin MRN: [id number] Date of Birth: [birth year] Referring Provider (PT): Lilian Kapur   Encounter Date: 01/28/2021   PT End of Session - 01/28/21 1509     Visit Number 2    Number of Visits 12    Date for PT Re-Evaluation 04/15/21    Authorization Type IE 01/21/2021    PT Start Time 1500    PT Stop Time 1545    PT Time Calculation (min) 45 min    Activity Tolerance Patient tolerated treatment well    Behavior During Therapy [facility] for tasks assessed/performed             Past Medical History:  Diagnosis Date   Ectopic pregnancy     Past Surgical History:  Procedure Laterality Date   laparascopic surgical procedure     2/2 to ectopic pregnancy    There were no vitals filed for this visit.   Subjective Assessment - 01/28/21 1506     Subjective Patient denies any changes since last visit.    Currently in Pain? 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 ABDOMINAL:  Palpation: No TTP Diastasis: < 1 finger superior, 3 finger at umbilicus, 2 finger inferior  SPECIAL TESTS: SLR (SN 92, -LR 0.29): R: Negative L:  Positive FABER (SN 81): R: Negative L: Negative FADIR (SN 94): R: Negative L: Negative Neuromuscular Re-education: Patient education on DRAM corrective exercises and postural endurance exercises including:  Hooklying Active Hamstring Stretch  Double Leg Hamstring Stretch at Wall  Cat-Camel Quadruped Transversus Abdominis Bracing  Quadruped Alternating Arm Lift  Quadruped Alternating Leg Extensions  Bird Dog  Supine Isometric Transversus Abdominis Contraction  Supine Transversus Abdominis Bracing with Heel Slide Supine March with Posterior Pelvic Tilt  Supine Bent Knee Foot  Taps Supine 90/90 leg extensions Leg Lifts      Patient educated throughout session on appropriate technique and form using multi-modal cueing, HEP, and activity modification. Patient articulated understanding and returned demonstration.  Patient Response to interventions: Comfortable to experiment with abdominal rehab HEP and return in 1 week.  ASSESSMENT Patient presents to clinic with excellent motivation to participate in therapy. Patient demonstrates deficits in posture, pain, core strength, and PFM function. Patient able to achieve basic understanding of TrA and deep core rehabilitative interventions during today's session and responded positively to educational interventions. Patient will benefit from continued skilled therapeutic intervention to address remaining deficits in posture, pain, core strength, and PFM function in order to increase function and improve overall QOL.      PT Long Term Goals - 01/22/21 1049       PT LONG TERM GOAL #1   Title Patient will demonstrate independence with HEP in order to maximize therapeutic gains and improve carryover from physical therapy sessions to ADLs in the home and community.    Baseline IE: not initiated    Time 12    Period Weeks    Status New    Target Date 04/15/21      PT LONG TERM GOAL #2   Title Patient will demonstrate improved function as evidenced by a score of 71 on FOTO measure for full participation in activities at home and in the community.  Baseline IE: 68    Time 12    Period Weeks    Status New    Target Date 04/15/21      PT LONG TERM GOAL #3   Title Patient will decrease worst pain as reported on NPRS by at least 2 points to demonstrate clinically significant reduction in pain in order to restore/improve function and overall QOL.    Baseline IE: 3/10    Time 12    Period Weeks    Status New    Target Date 04/15/21      PT LONG TERM GOAL #4   Title Patient will demonstrate improved PFM coordination  as evidenced by post-micturition UI occurring < 2 times over the course of a 10 day period for improved function at home and in the community.    Baseline IE: 25%    Time 12    Period Weeks    Status New    Target Date 04/15/21      PT LONG TERM GOAL #5   Title Patient will demonstrate appropriate body mechanics with lifting and squatting with IAP management strategies for improved function at home and in the community.    Baseline IE: not demonstrated    Time 12    Period Weeks    Status New    Target Date 04/15/21                   Plan - 01/28/21 1509     Clinical Impression Statement Patient presents to clinic with excellent motivation to participate in therapy. Patient demonstrates deficits in posture, pain, core strength, and PFM function. Patient able to achieve basic understanding of TrA and deep core rehabilitative interventions during today's session and responded positively to educational interventions. Patient will benefit from continued skilled therapeutic intervention to address remaining deficits in posture, pain, core strength, and PFM function in order to increase function and improve overall QOL.    Personal Factors and Comorbidities Behavior Pattern;Comorbidity 1;Time since onset of injury/illness/exacerbation;Past/Current Experience;Profession    Comorbidities HTN    Examination-Activity Limitations Lift;Squat;Bend;Sit    Examination-Participation Restrictions Laundry;Cleaning;Yard Work;Community Activity    Stability/Clinical Decision Making Evolving/Moderate complexity    Rehab Potential Excellent    PT Frequency 1x / week    PT Duration 12 weeks    PT Treatment/Interventions Cryotherapy;Moist Heat;Electrical Stimulation;Therapeutic exercise;Neuromuscular re-education;Patient/family education;Orthotic Fit/Training;Manual techniques;Scar mobilization;Taping;Dry needling;Spinal Manipulations;Joint Manipulations    PT Next Visit Plan abdominal progression     PT Home Exercise Plan [email or web address]    Consulted and Agree with Plan of Care Patient             Patient will benefit from skilled therapeutic intervention in order to improve the following deficits and impairments:  Postural dysfunction, Improper body mechanics, Decreased strength, Decreased coordination, Decreased activity tolerance, Decreased endurance  Visit Diagnosis: Other lack of coordination  Diastasis recti  Muscle weakness (generalized)     Problem List Patient Active Problem List   Diagnosis Date Noted   History of ectopic pregnancy 01/18/2017    Sheria Lang PT, DPT [phone]  01/28/2021, 4:26 PM  [facility] [address] [place], [place], [address] Phone: [phone]   Fax:  [phone]  Name: Marieme Mcmackin MRN: [id number] Date of Birth: [birth year]

## 2021-02-04 ENCOUNTER — Ambulatory Visit: Payer: BC Managed Care – PPO | Attending: Nurse Practitioner | Admitting: Physical Therapy

## 2021-02-04 DIAGNOSIS — M6208 Separation of muscle (nontraumatic), other site: Secondary | ICD-10-CM | POA: Insufficient documentation

## 2021-02-04 DIAGNOSIS — R278 Other lack of coordination: Secondary | ICD-10-CM | POA: Insufficient documentation

## 2021-02-04 DIAGNOSIS — M6281 Muscle weakness (generalized): Secondary | ICD-10-CM | POA: Insufficient documentation

## 2021-02-11 ENCOUNTER — Ambulatory Visit: Payer: BC Managed Care – PPO | Admitting: Physical Therapy

## 2021-02-11 ENCOUNTER — Encounter: Payer: Self-pay | Admitting: Physical Therapy

## 2021-02-11 ENCOUNTER — Other Ambulatory Visit: Payer: Self-pay

## 2021-02-11 DIAGNOSIS — M6208 Separation of muscle (nontraumatic), other site: Secondary | ICD-10-CM

## 2021-02-11 DIAGNOSIS — M6281 Muscle weakness (generalized): Secondary | ICD-10-CM | POA: Diagnosis present

## 2021-02-11 DIAGNOSIS — R278 Other lack of coordination: Secondary | ICD-10-CM | POA: Diagnosis present

## 2021-02-12 NOTE — Therapy (Signed)
[facility] [address] [place], [place], [address] Phone: [phone]   Fax:  [phone]  Physical Therapy Treatment  Patient Details  Name: Megan Goodwin MRN: [id number] Date of Birth: March 26, 1986 Referring Provider (PT): Lilian Kapur   Encounter Date: 02/11/2021   PT End of Session - 02/12/21 1223     Visit Number 3    Number of Visits 12    Date for PT Re-Evaluation 04/15/21    Authorization Type IE 01/21/2021    PT Start Time 1500    PT Stop Time 1545    PT Time Calculation (min) 45 min    Activity Tolerance Patient tolerated treatment well    Behavior During Therapy [facility] for tasks assessed/performed             Past Medical History:  Diagnosis Date   Ectopic pregnancy     Past Surgical History:  Procedure Laterality Date   laparascopic surgical procedure     2/2 to ectopic pregnancy    There were no vitals filed for this visit.   Subjective Assessment - 02/11/21 1711     Subjective Patient notes increased stress since last session. Notes she did not have time to do exercises.            TREATMENT Neuromuscular Re-education: [facility] Supine diaphragmatic breathing Supine TrA with coordinated breath Supine pelvic tilts with TrA, coordinated breath Supine marches with TrA, coordinated breath Patient education on signs of mm fatigue, strategies to layer strengthening throughout the day, and common compensations for improved performance with HEP.   Patient educated throughout session on appropriate technique and form using multi-modal cueing, HEP, and activity modification. Patient articulated understanding and returned demonstration.  Patient Response to interventions: Comfortable to experiment with abdominal rehab HEP and return in 2 weeks.  ASSESSMENT Patient presents to clinic with excellent motivation to participate in therapy. Patient demonstrates deficits in  posture, pain, core strength, and PFM function. Patient with excellent TrA coordination and awareness during today's session despite limited endurance. Patient  responded positively to active and educational interventions. Patient will benefit from continued skilled therapeutic intervention to address remaining deficits in posture, pain, core strength, and PFM function in order to increase function and improve overall QOL.    PT Long Term Goals - 01/22/21 1049       PT LONG TERM GOAL #1   Title Patient will demonstrate independence with HEP in order to maximize therapeutic gains and improve carryover from physical therapy sessions to ADLs in the home and community.    Baseline IE: not initiated    Time 12    Period Weeks    Status New    Target Date 04/15/21      PT LONG TERM GOAL #2   Title Patient will demonstrate improved function as evidenced by a score of 71 on FOTO measure for full participation in activities at home and in the community.    Baseline IE: 68    Time 12    Period Weeks    Status New    Target Date 04/15/21      PT LONG TERM GOAL #3   Title Patient will decrease worst pain as reported on NPRS by at least 2 points to demonstrate clinically significant reduction in pain in order to restore/improve function and overall QOL.    Baseline IE: 3/10    Time 12    Period Weeks    Status New  Target Date 04/15/21      PT LONG TERM GOAL #4   Title Patient will demonstrate improved PFM coordination as evidenced by post-micturition UI occurring < 2 times over the course of a 10 day period for improved function at home and in the community.    Baseline IE: 25%    Time 12    Period Weeks    Status New    Target Date 04/15/21      PT LONG TERM GOAL #5   Title Patient will demonstrate appropriate body mechanics with lifting and squatting with IAP management strategies for improved function at home and in the community.    Baseline IE: not demonstrated    Time 12     Period Weeks    Status New    Target Date 04/15/21                   Plan - 02/12/21 1223     Clinical Impression Statement Patient presents to clinic with excellent motivation to participate in therapy. Patient demonstrates deficits in posture, pain, core strength, and PFM function. Patient with excellent TrA coordination and awareness during today's session despite limited endurance. Patient  responded positively to active and educational interventions. Patient will benefit from continued skilled therapeutic intervention to address remaining deficits in posture, pain, core strength, and PFM function in order to increase function and improve overall QOL.    Personal Factors and Comorbidities Behavior Pattern;Comorbidity 1;Time since onset of injury/illness/exacerbation;Past/Current Experience;Profession    Comorbidities HTN    Examination-Activity Limitations Lift;Squat;Bend;Sit    Examination-Participation Restrictions Laundry;Cleaning;Yard Work;Community Activity    Stability/Clinical Decision Making Evolving/Moderate complexity    Rehab Potential Excellent    PT Frequency 1x / week    PT Duration 12 weeks    PT Treatment/Interventions Cryotherapy;Moist Heat;Electrical Stimulation;Therapeutic exercise;Neuromuscular re-education;Patient/family education;Orthotic Fit/Training;Manual techniques;Scar mobilization;Taping;Dry needling;Spinal Manipulations;Joint Manipulations    PT Next Visit Plan abdominal progression    PT Home Exercise Plan [id number]    Consulted and Agree with Plan of Care Patient             Patient will benefit from skilled therapeutic intervention in order to improve the following deficits and impairments:  Postural dysfunction, Improper body mechanics, Decreased strength, Decreased coordination, Decreased activity tolerance, Decreased endurance  Visit Diagnosis: Other lack of coordination  Diastasis recti  Muscle weakness (generalized)     Problem  List Patient Active Problem List   Diagnosis Date Noted   History of ectopic pregnancy 01/18/2017    Sheria Lang PT, DPT [phone]  02/12/2021, 12:29 PM  [facility] [address] [place], [place], [address] Phone: [phone]   Fax:  [phone]  Name: Megan Goodwin MRN: [id number] Date of Birth: 19-Sep-1985

## 2021-02-18 ENCOUNTER — Encounter: Payer: BC Managed Care – PPO | Admitting: Physical Therapy

## 2021-02-25 ENCOUNTER — Encounter: Payer: BC Managed Care – PPO | Admitting: Physical Therapy

## 2021-03-04 ENCOUNTER — Ambulatory Visit: Payer: BC Managed Care – PPO | Attending: Nurse Practitioner | Admitting: Physical Therapy

## 2021-03-04 ENCOUNTER — Other Ambulatory Visit: Payer: Self-pay

## 2021-03-04 ENCOUNTER — Encounter: Payer: Self-pay | Admitting: Physical Therapy

## 2021-03-04 DIAGNOSIS — M6281 Muscle weakness (generalized): Secondary | ICD-10-CM | POA: Diagnosis present

## 2021-03-04 DIAGNOSIS — M6208 Separation of muscle (nontraumatic), other site: Secondary | ICD-10-CM | POA: Diagnosis present

## 2021-03-04 DIAGNOSIS — R278 Other lack of coordination: Secondary | ICD-10-CM | POA: Diagnosis present

## 2021-03-04 NOTE — Therapy (Signed)
[facility] [address] [place], [place], [address] Phone: [phone]   Fax:  [phone]  Physical Therapy Treatment  Patient Details  Name: Megan Goodwin MRN: [id number] Date of Birth: 1986-06-02 Referring Provider (PT): Lilian Kapur   Encounter Date: 03/04/2021   PT End of Session - 03/04/21 1511     Visit Number 4    Number of Visits 12    Date for PT Re-Evaluation 04/15/21    Authorization Type IE 01/21/2021    PT Start Time 1500    PT Stop Time 1540    PT Time Calculation (min) 40 min    Activity Tolerance Patient tolerated treatment well    Behavior During Therapy [facility] for tasks assessed/performed             Past Medical History:  Diagnosis Date   Ectopic pregnancy     Past Surgical History:  Procedure Laterality Date   laparascopic surgical procedure     2/2 to ectopic pregnancy    There were no vitals filed for this visit.   Subjective Assessment - 03/04/21 1505     Subjective Patient reports decreased stress since last session. Patient has been working on standing HEP for deep core and trunk stabilization.    Currently in Pain? No/denies              TREATMENT Neuromuscular Re-education: Advanced abdominal/postural control activities with min tactile cueing at abdominals  Hip hinge with 5# KB  Wall push up  Wall bird-dog progression  Hip flexion isometric hold with 5# KB chest press   Patient educated throughout session on appropriate technique and form using multi-modal cueing, HEP, and activity modification. Patient articulated understanding and returned demonstration.  Patient Response to interventions: Comfortable to experiment with abdominal rehab HEP and return in 2 weeks.  ASSESSMENT Patient presents to clinic with excellent motivation to participate in therapy. Patient demonstrates deficits in posture, pain, core strength, and PFM function. Patient with good DRAM closure with  wall TrA activation interventions during today's session and responded well to standing postural control progressions. Patient will benefit from continued skilled therapeutic intervention to address remaining deficits in posture, pain, core strength, and PFM function in order to increase function and improve overall QOL.       PT Long Term Goals - 03/04/21 1515       PT LONG TERM GOAL #1   Title Patient will demonstrate independence with HEP in order to maximize therapeutic gains and improve carryover from physical therapy sessions to ADLs in the home and community.    Baseline IE: not initiated; 9/1: moderate consistency    Time 12    Period Weeks    Status On-going    Target Date 04/15/21      PT LONG TERM GOAL #2   Title Patient will demonstrate improved function as evidenced by a score of 71 on FOTO measure for full participation in activities at home and in the community.    Baseline IE: 68    Time 12    Period Weeks    Status New    Target Date 04/15/21      PT LONG TERM GOAL #3   Title Patient will decrease worst pain as reported on NPRS by at least 2 points to demonstrate clinically significant reduction in pain in order to restore/improve function and overall QOL.    Baseline IE: 3/10; 9/1: 3/10 singular instance of bed mobility with twisting, otherwise 0/10  Time 12    Period Weeks    Status On-going    Target Date 04/15/21      PT LONG TERM GOAL #4   Title Patient will demonstrate improved PFM coordination as evidenced by post-micturition UI occurring < 2 times over the course of a 10 day period for improved function at home and in the community.    Baseline IE: 25%; 9/1: 0x    Time 12    Period Weeks    Status On-going    Target Date 04/15/21      PT LONG TERM GOAL #5   Title Patient will demonstrate appropriate body mechanics with lifting and squatting with IAP management strategies for improved function at home and in the community.    Baseline IE: not  demonstrated    Time 12    Period Weeks    Status On-going    Target Date 04/15/21                   Plan - 03/04/21 1512     Clinical Impression Statement Patient presents to clinic with excellent motivation to participate in therapy. Patient demonstrates deficits in posture, pain, core strength, and PFM function. Patient with good DRAM closure with wall TrA activation interventions during today's session and responded well to standing postural control progressions. Patient will benefit from continued skilled therapeutic intervention to address remaining deficits in posture, pain, core strength, and PFM function in order to increase function and improve overall QOL.    Personal Factors and Comorbidities Behavior Pattern;Comorbidity 1;Time since onset of injury/illness/exacerbation;Past/Current Experience;Profession    Comorbidities HTN    Examination-Activity Limitations Lift;Squat;Bend;Sit    Examination-Participation Restrictions Laundry;Cleaning;Yard Work;Community Activity    Stability/Clinical Decision Making Evolving/Moderate complexity    Rehab Potential Excellent    PT Frequency 1x / week    PT Duration 12 weeks    PT Treatment/Interventions Cryotherapy;Moist Heat;Electrical Stimulation;Therapeutic exercise;Neuromuscular re-education;Patient/family education;Orthotic Fit/Training;Manual techniques;Scar mobilization;Taping;Dry needling;Spinal Manipulations;Joint Manipulations    PT Next Visit Plan abdominal progression    PT Home Exercise Plan [email or web address]    Consulted and Agree with Plan of Care Patient             Patient will benefit from skilled therapeutic intervention in order to improve the following deficits and impairments:  Postural dysfunction, Improper body mechanics, Decreased strength, Decreased coordination, Decreased activity tolerance, Decreased endurance  Visit Diagnosis: Other lack of coordination  Diastasis recti  Muscle weakness  (generalized)     Problem List Patient Active Problem List   Diagnosis Date Noted   History of ectopic pregnancy 01/18/2017    Sheria Lang PT, DPT [phone]  03/05/2021, 12:50 PM  [facility] [address] [place], [place], [address] Phone: [phone]   Fax:  [phone]  Name: Megan Goodwin MRN: [id number] Date of Birth: 1986/02/09

## 2021-03-11 ENCOUNTER — Encounter: Payer: BC Managed Care – PPO | Admitting: Physical Therapy

## 2021-03-18 ENCOUNTER — Encounter: Payer: BC Managed Care – PPO | Admitting: Physical Therapy

## 2021-03-23 ENCOUNTER — Encounter: Payer: Self-pay | Admitting: Physical Therapy

## 2021-04-01 ENCOUNTER — Ambulatory Visit: Payer: BC Managed Care – PPO | Admitting: Physical Therapy

## 2021-04-01 ENCOUNTER — Other Ambulatory Visit: Payer: Self-pay

## 2021-04-01 ENCOUNTER — Encounter: Payer: Self-pay | Admitting: Physical Therapy

## 2021-04-01 DIAGNOSIS — R278 Other lack of coordination: Secondary | ICD-10-CM

## 2021-04-01 DIAGNOSIS — M6281 Muscle weakness (generalized): Secondary | ICD-10-CM

## 2021-04-01 DIAGNOSIS — M6208 Separation of muscle (nontraumatic), other site: Secondary | ICD-10-CM

## 2021-04-01 NOTE — Therapy (Signed)
[facility] [address] [place], [place], [address] Phone: [phone]   Fax:  [phone]  Physical Therapy Treatment  Patient Details  Name: Megan Goodwin MRN: [id number] Date of Birth: 06-23-1986 Referring Provider (PT): Lilian Kapur   Encounter Date: 04/01/2021   PT End of Session - 04/01/21 1533     Visit Number 5    Number of Visits 12    Date for PT Re-Evaluation 04/15/21    Authorization Type IE 01/21/2021    PT Start Time 1500    PT Stop Time 1540    PT Time Calculation (min) 40 min    Activity Tolerance Patient tolerated treatment well    Behavior During Therapy [facility] for tasks assessed/performed             Past Medical History:  Diagnosis Date   Ectopic pregnancy     Past Surgical History:  Procedure Laterality Date   laparascopic surgical procedure     2/2 to ectopic pregnancy    There were no vitals filed for this visit.   Subjective Assessment - 04/01/21 1506     Subjective Patient notes that she feels her hemorrhoids have become active recently; notes that she feels this may be diet related. Patient also notes that she has had a mild increase in frequency/incomplete emptying (happening at night with nursing).    Currently in Pain? No/denies             TREATMENT Neuromuscular Re-education: Reassessed goals; see below.  Patient education on continuing to prioritize bADLs and rest/recovery over PT prescribed HEP when short on energy or time.  Patient participated in lengthy discussion on variables impacting meaningful participation in physical therapy. DRAM assessment: < 1 finger superior, 1 finger at umbilicus, < 1 finger inferior. Minimally increased depth at umbilicus.   Patient educated throughout session on appropriate technique and form using multi-modal cueing, HEP, and activity modification. Patient articulated understanding and returned demonstration.  Patient Response to  interventions: Comfortable to continue independently until f/u in 1 month.  ASSESSMENT Patient presents to clinic with excellent motivation to participate in therapy. Patient demonstrates deficits in posture, pain, core strength, and PFM function. Patient demonstrating closure of DRAM during today's session and indicating progress toward goal achievement in all other areas. Patient noting some difficulty with managing consistent participation in clinic and at home with other responsibilities, but is interested in following up in 1 month to reassess readiness for continued participation. Patient has responded well to active and educational interventions throughout the brief course of therapy. Patient will benefit from continued skilled therapeutic intervention to address remaining deficits in posture, pain, core strength, and PFM function in order to increase function and improve overall QOL.    PT Long Term Goals - 04/01/21 1533       PT LONG TERM GOAL #1   Title Patient will demonstrate independence with HEP in order to maximize therapeutic gains and improve carryover from physical therapy sessions to ADLs in the home and community.    Baseline IE: not initiated; 9/1: moderate consistency; 9/29: IND    Time 12    Period Weeks    Status Achieved    Target Date 04/15/21      PT LONG TERM GOAL #2   Title Patient will demonstrate improved function as evidenced by a score of 71 on FOTO measure for full participation in activities at home and in the community.    Baseline IE: 68; 9/1:  81; 9/29: 68    Time 12    Period Weeks    Status On-going    Target Date 04/15/21      PT LONG TERM GOAL #3   Title Patient will decrease worst pain as reported on NPRS by at least 2 points to demonstrate clinically significant reduction in pain in order to restore/improve function and overall QOL.    Baseline IE: 3/10; 9/1: 3/10 singular instance of bed mobility with twisting, otherwise 0/10; 9/29: 0/10     Time 12    Period Weeks    Status Achieved    Target Date 04/15/21      PT LONG TERM GOAL #4   Title Patient will demonstrate improved PFM coordination as evidenced by post-micturition UI occurring < 2 times over the course of a 10 day period for improved function at home and in the community.    Baseline IE: 25%; 9/1: 0x; 9/29: incomplete emptying at night    Time 12    Period Weeks    Status On-going    Target Date 04/15/21      PT LONG TERM GOAL #5   Title Patient will demonstrate appropriate body mechanics with lifting and squatting with IAP management strategies for improved function at home and in the community.    Baseline IE: not demonstrated; 9/29: demonstrated    Time 12    Period Weeks    Status Achieved    Target Date 04/15/21                   Plan - 04/01/21 1533     Clinical Impression Statement Patient presents to clinic with excellent motivation to participate in therapy. Patient demonstrates deficits in posture, pain, core strength, and PFM function. Patient demonstrating closure of DRAM during today's session and indicating progress toward goal achievement in all other areas. Patient noting some difficulty with managing consistent participation in clinic and at home with other responsibilities, but is interested in following up in 1 month to reassess readiness for continued participation. Patient has responded well to active and educational interventions throughout the brief course of therapy. Patient will benefit from continued skilled therapeutic intervention to address remaining deficits in posture, pain, core strength, and PFM function in order to increase function and improve overall QOL.    Personal Factors and Comorbidities Behavior Pattern;Comorbidity 1;Time since onset of injury/illness/exacerbation;Past/Current Experience;Profession    Comorbidities HTN    Examination-Activity Limitations Lift;Squat;Bend;Sit    Examination-Participation Restrictions  Laundry;Cleaning;Yard Work;Community Activity    Stability/Clinical Decision Making Evolving/Moderate complexity    Rehab Potential Excellent    PT Frequency 1x / week    PT Duration 12 weeks    PT Treatment/Interventions Cryotherapy;Moist Heat;Electrical Stimulation;Therapeutic exercise;Neuromuscular re-education;Patient/family education;Orthotic Fit/Training;Manual techniques;Scar mobilization;Taping;Dry needling;Spinal Manipulations;Joint Manipulations    PT Next Visit Plan abdominal progression    PT Home Exercise Plan [id number]    Consulted and Agree with Plan of Care Patient             Patient will benefit from skilled therapeutic intervention in order to improve the following deficits and impairments:  Postural dysfunction, Improper body mechanics, Decreased strength, Decreased coordination, Decreased activity tolerance, Decreased endurance  Visit Diagnosis: Other lack of coordination  Diastasis recti  Muscle weakness (generalized)     Problem List Patient Active Problem List   Diagnosis Date Noted   History of ectopic pregnancy 01/18/2017    Sheria Lang PT, DPT [phone]  04/01/2021, 5:06 PM  [place]  [facility] [address] [place], [place], [address] Phone: [phone]   Fax:  [phone]  Name: Megan Goodwin MRN: [id number] Date of Birth: [birth year]

## 2021-04-08 ENCOUNTER — Encounter: Payer: Self-pay | Admitting: Physical Therapy

## 2021-04-29 ENCOUNTER — Ambulatory Visit: Payer: BC Managed Care – PPO | Attending: Nurse Practitioner | Admitting: Physical Therapy

## 2021-04-29 ENCOUNTER — Encounter: Payer: Self-pay | Admitting: Physical Therapy

## 2021-04-29 ENCOUNTER — Other Ambulatory Visit: Payer: Self-pay

## 2021-04-29 DIAGNOSIS — M6208 Separation of muscle (nontraumatic), other site: Secondary | ICD-10-CM | POA: Insufficient documentation

## 2021-04-29 DIAGNOSIS — M6281 Muscle weakness (generalized): Secondary | ICD-10-CM | POA: Diagnosis present

## 2021-04-29 DIAGNOSIS — R278 Other lack of coordination: Secondary | ICD-10-CM | POA: Diagnosis not present

## 2021-04-29 NOTE — Therapy (Signed)
[place] [facility] [address] [place], [place], [address] Phone: [phone]   Fax:  [phone]  Physical Therapy Treatment  Patient Details  Name: Megan Goodwin MRN: [id number] Date of Birth: [birth year] Referring Provider (PT): Lilian Kapur   Encounter Date: 04/29/2021   PT End of Session - 04/29/21 1547     Visit Number 6    Number of Visits 13    Date for PT Re-Evaluation 04/29/21    Authorization Type IE 01/21/2021    PT Start Time 1545    PT Stop Time 1625    PT Time Calculation (min) 40 min    Activity Tolerance Patient tolerated treatment well    Behavior During Therapy [facility] for tasks assessed/performed             Past Medical History:  Diagnosis Date   Ectopic pregnancy     Past Surgical History:  Procedure Laterality Date   laparascopic surgical procedure     2/2 to ectopic pregnancy    There were no vitals filed for this visit.      TREATMENT Neuromuscular Re-education: Reassessed goals; see below.  Discussed s/s that would warrant return to clinic.  Reassessed DRAM: < 1 finger width throughout.    Patient educated throughout session on appropriate technique and form using multi-modal cueing, HEP, and activity modification. Patient articulated understanding and returned demonstration.  Patient Response to interventions: Comfortable to discharge.  ASSESSMENT Patient presents to clinic with excellent motivation to participate in therapy. Patient demonstrates deficits in posture, pain, core strength, and PFM function. Patient indicating goal achievement in all areas and competence and comfort with self-management. Patient has responded well to active and educational interventions throughout the brief course of therapy and is appropriate for discharge to self-management of  posture, pain, core strength, and PFM function in order to maintain improved function and QOL.    PT Long Term Goals - 04/29/21  1548       PT LONG TERM GOAL #1   Title Patient will demonstrate independence with HEP in order to maximize therapeutic gains and improve carryover from physical therapy sessions to ADLs in the home and community.    Baseline IE: not initiated; 9/1: moderate consistency; 9/29: IND    Time 12    Period Weeks    Status Achieved      PT LONG TERM GOAL #2   Title Patient will demonstrate improved function as evidenced by a score of 71 on FOTO measure for full participation in activities at home and in the community.    Baseline IE: 68; 9/1: 81; 9/29: 68; 10/27: 81    Time 12    Period Weeks    Status Achieved      PT LONG TERM GOAL #3   Title Patient will decrease worst pain as reported on NPRS by at least 2 points to demonstrate clinically significant reduction in pain in order to restore/improve function and overall QOL.    Baseline IE: 3/10; 9/1: 3/10 singular instance of bed mobility with twisting, otherwise 0/10; 9/29: 0/10; 10/27: 0/10    Time 12    Period Weeks    Status Achieved      PT LONG TERM GOAL #4   Title Patient will demonstrate improved PFM coordination as evidenced by post-micturition UI occurring < 2 times over the course of a 10 day period for improved function at home and in the community.    Baseline IE: 25%; 9/1: 0x; 9/29:  incomplete emptying at night; 10/27: 0x    Time 12    Period Weeks    Status Achieved      PT LONG TERM GOAL #5   Title Patient will demonstrate appropriate body mechanics with lifting and squatting with IAP management strategies for improved function at home and in the community.    Baseline IE: not demonstrated; 9/29: demonstrated    Time 12    Period Weeks    Status Achieved                   Plan - 04/29/21 1548     Clinical Impression Statement Patient presents to clinic with excellent motivation to participate in therapy. Patient demonstrates deficits in posture, pain, core strength, and PFM function. Patient indicating  goal achievement in all areas and competence and comfort with self-management. Patient has responded well to active and educational interventions throughout the brief course of therapy and is appropriate for discharge to self-management of  posture, pain, core strength, and PFM function in order to maintain improved function and QOL.    Personal Factors and Comorbidities Behavior Pattern;Comorbidity 1;Time since onset of injury/illness/exacerbation;Past/Current Experience;Profession    Comorbidities HTN    Examination-Activity Limitations Lift;Squat;Bend;Sit    Examination-Participation Restrictions Laundry;Cleaning;Yard Work;Community Activity    Stability/Clinical Decision Making Evolving/Moderate complexity    Rehab Potential Excellent    PT Frequency One time visit    PT Duration --    PT Treatment/Interventions Cryotherapy;Moist Heat;Electrical Stimulation;Therapeutic exercise;Neuromuscular re-education;Patient/family education;Orthotic Fit/Training;Manual techniques;Scar mobilization;Taping;Dry needling;Spinal Manipulations;Joint Manipulations    PT Next Visit Plan --    PT Home Exercise Plan [email or web address]    Consulted and Agree with Plan of Care Patient             Patient will benefit from skilled therapeutic intervention in order to improve the following deficits and impairments:  Postural dysfunction, Improper body mechanics, Decreased strength, Decreased coordination, Decreased activity tolerance, Decreased endurance  Visit Diagnosis: Other lack of coordination  Diastasis recti  Muscle weakness (generalized)     Problem List Patient Active Problem List   Diagnosis Date Noted   History of ectopic pregnancy 01/18/2017    Sheria Lang PT, DPT [phone]  04/29/2021, 4:41 PM  [place] [facility] [address]. [place], [place], [address] Phone: [phone]   Fax:  [phone]  Name: Megan Goodwin MRN: [id number] Date of Birth:  September 04, 1985
